# Patient Record
Sex: Female | Born: 1980 | Hispanic: Yes | Marital: Married | Smoking: Never smoker
Health system: Southern US, Community
[De-identification: ages and names within clinical notes are randomized; demographics above are authoritative.]

## PROBLEM LIST (undated history)

## (undated) HISTORY — PX: CHOLECYSTECTOMY: SHX55

---

## 2006-03-15 ENCOUNTER — Observation Stay: Payer: Self-pay | Admitting: Obstetrics and Gynecology

## 2006-03-16 ENCOUNTER — Observation Stay: Payer: Self-pay | Admitting: Obstetrics and Gynecology

## 2006-06-02 ENCOUNTER — Inpatient Hospital Stay: Payer: Self-pay

## 2013-12-20 ENCOUNTER — Emergency Department: Payer: Self-pay | Admitting: Emergency Medicine

## 2013-12-20 LAB — URINALYSIS, COMPLETE
BLOOD: NEGATIVE
Bacteria: NONE SEEN
Bilirubin,UR: NEGATIVE
GLUCOSE, UR: NEGATIVE mg/dL (ref 0–75)
Ketone: NEGATIVE
LEUKOCYTE ESTERASE: NEGATIVE
Nitrite: NEGATIVE
PROTEIN: NEGATIVE
Ph: 5 (ref 4.5–8.0)
Specific Gravity: 1.012 (ref 1.003–1.030)
Squamous Epithelial: 2
WBC UR: 1 /HPF (ref 0–5)

## 2013-12-20 LAB — COMPREHENSIVE METABOLIC PANEL
ALT: 19 U/L (ref 12–78)
ANION GAP: 8 (ref 7–16)
Albumin: 3.8 g/dL (ref 3.4–5.0)
Alkaline Phosphatase: 57 U/L
BILIRUBIN TOTAL: 0.3 mg/dL (ref 0.2–1.0)
BUN: 10 mg/dL (ref 7–18)
Calcium, Total: 8.5 mg/dL (ref 8.5–10.1)
Chloride: 107 mmol/L (ref 98–107)
Co2: 24 mmol/L (ref 21–32)
Creatinine: 0.51 mg/dL — ABNORMAL LOW (ref 0.60–1.30)
EGFR (Non-African Amer.): >60
GLUCOSE: 89 mg/dL (ref 65–99)
OSMOLALITY: 276 (ref 275–301)
POTASSIUM: 3.8 mmol/L (ref 3.5–5.1)
SGOT(AST): 17 U/L (ref 15–37)
SODIUM: 139 mmol/L (ref 136–145)
TOTAL PROTEIN: 7 g/dL (ref 6.4–8.2)

## 2013-12-20 LAB — CBC WITH DIFFERENTIAL/PLATELET
BASOS PCT: 1.2 %
Basophil #: 0.1 10*3/uL (ref 0.0–0.1)
EOS ABS: 0.1 10*3/uL (ref 0.0–0.7)
Eosinophil %: 1 %
HCT: 32.3 % — ABNORMAL LOW (ref 35.0–47.0)
HGB: 10 g/dL — ABNORMAL LOW (ref 12.0–16.0)
LYMPHS ABS: 1.6 10*3/uL (ref 1.0–3.6)
Lymphocyte %: 30.6 %
MCH: 20.5 pg — AB (ref 26.0–34.0)
MCHC: 30.9 g/dL — ABNORMAL LOW (ref 32.0–36.0)
MCV: 66 fL — ABNORMAL LOW (ref 80–100)
Monocyte #: 0.4 x10 3/mm (ref 0.2–0.9)
Monocyte %: 7.1 %
Neutrophil #: 3 10*3/uL (ref 1.4–6.5)
Neutrophil %: 60.1 %
Platelet: 331 10*3/uL (ref 150–440)
RBC: 4.88 10*6/uL (ref 3.80–5.20)
RDW: 17.4 % — ABNORMAL HIGH (ref 11.5–14.5)
WBC: 5.1 10*3/uL (ref 3.6–11.0)

## 2013-12-20 LAB — LIPASE, BLOOD: LIPASE: 134 U/L (ref 73–393)

## 2019-05-12 ENCOUNTER — Other Ambulatory Visit: Payer: Self-pay

## 2019-05-12 DIAGNOSIS — Z20822 Contact with and (suspected) exposure to covid-19: Secondary | ICD-10-CM

## 2019-05-14 LAB — NOVEL CORONAVIRUS, NAA: SARS-CoV-2, NAA: DETECTED — AB

## 2019-06-15 ENCOUNTER — Telehealth: Payer: Self-pay | Admitting: *Deleted

## 2019-06-15 NOTE — Telephone Encounter (Signed)
Patient called and is requesting COVID - 19 results mailed to her home address .   [place], [place] , [address] ,she has applied for asistance and they are requiring a copy of her results .

## 2019-07-02 ENCOUNTER — Telehealth: Payer: Self-pay

## 2019-07-02 NOTE — Telephone Encounter (Signed)
Patient requested to have covid result faxed to [employer] at [phone]. Request was sent to [facility] to fax.

## 2019-07-02 NOTE — Telephone Encounter (Signed)
Results faxed as requested

## 2019-11-26 ENCOUNTER — Encounter: Payer: Self-pay | Admitting: Dermatology

## 2019-11-26 ENCOUNTER — Other Ambulatory Visit: Payer: Self-pay

## 2019-11-26 ENCOUNTER — Ambulatory Visit: Payer: Medicaid Other | Admitting: Dermatology

## 2019-11-26 DIAGNOSIS — L820 Inflamed seborrheic keratosis: Secondary | ICD-10-CM

## 2019-11-26 DIAGNOSIS — L811 Chloasma: Secondary | ICD-10-CM | POA: Diagnosis not present

## 2019-11-26 NOTE — Patient Instructions (Addendum)
Recommend taking Heliocare sun protection supplement daily in sunny weather for additional sun protection. For maximum protection on the sunniest days, you can take up to 2 capsules of regular Heliocare OR take 1 capsule of Heliocare Ultra. For prolonged exposure (such as a full day in the sun), you can repeat your dose of the supplement 4 hours after your first dose. Heliocare can be purchased at [facility] or at [email or web address].    Instructions for [facility] Medications  One or more of your medications was sent to the [facility] mail order compounding pharmacy. You will receive an email from them and can purchase the medicine through that link. It will then be mailed to your home at the address you confirmed. If for any reason you do not receive an email from them, please check your spam folder. If you still do not find the email, please let us know.

## 2019-11-26 NOTE — Progress Notes (Addendum)
   New Patient Visit  Subjective  Sabrina Wolfe is a 39 y.o. female who presents for the following: growths (neck ~62m, itchy) and darker spots (face ~1-14yrs, not on birth control pills).  Has had four children but didn't have problems with dark spots until recently.   The following portions of the chart were reviewed this encounter and updated as appropriate:      Review of Systems:  No other skin or systemic complaints except as noted in HPI or Assessment and Plan.  Objective  Well appearing patient in no apparent distress; mood and affect are within normal limits.  A focused examination was performed including face, neck. Relevant physical exam findings are noted in the Assessment and Plan.  Objective  Neck x 32 (32): Multiple tiny waxy brown paps, itchy per patient  Objective  bil cheeks: Reticulated hyperpigmented patches.   Images       Assessment & Plan  Inflamed seborrheic keratosis (32) Neck x 32  Discussed ED, multiple txts needed to clear due to numerous lesions  ED x 32 today, wound care discussed    Destruction of lesion - Neck x 32 Complexity: extensive   Destruction method comment:  Electrodessication Informed consent: discussed and consent obtained   Outcome: patient tolerated procedure well with no complications   Post-procedure details: wound care instructions given    Melasma bil cheeks  Discussed chronic condition, worse in summer due to higher UV exposure.  Oral estrogen containing BCPs or supplements can exacerbate condition.  Recommend daily broad spectrum tinted sunscreen SPF 30+ to face, preferably with Zinc or Titanium Dioxide. Discussed Rx topical bleaching creams (i.e. hydroquinone), OTC HelioCare supplement, chemical peels (would need multiple for best result).    Recommend Elta UV Clear 46 tinted  Start [facility] Hydroquinone 8%, Tretinoin 0.1%, Kojic acid 1%, Niacinamide 4%, Fluocinolone 0.025% cream, a pea  sized amount nightly to dark spots on face for up to 2 months 60g 1rf. This cannot be used more than 3 months due to risk of exogenous ochronosis (permanent dark spots). The patient was advised this is not covered by insurance. They will receive an email to check out and the medication will be mailed to their home.       Return in about 2 months (around 01/26/2020) for ISK to txt with ED, and Melasma f/u.     Documentation: I have reviewed the above documentation for accuracy and completeness, and I agree with the above.  Willeen Niece, MD   I, Ardis Rowan, RMA, am acting as scribe for Willeen Niece, MD .

## 2019-12-14 NOTE — Addendum Note (Signed)
Addended by: Willeen Niece on: 12/14/2019 04:28 PM   Modules accepted: Level of Service

## 2020-01-27 ENCOUNTER — Ambulatory Visit: Payer: Medicaid Other | Admitting: Dermatology

## 2020-06-14 ENCOUNTER — Other Ambulatory Visit: Payer: Self-pay

## 2020-06-14 ENCOUNTER — Ambulatory Visit: Payer: Medicaid Other | Admitting: Dermatology

## 2020-06-14 DIAGNOSIS — L820 Inflamed seborrheic keratosis: Secondary | ICD-10-CM

## 2020-06-14 DIAGNOSIS — L811 Chloasma: Secondary | ICD-10-CM

## 2020-06-14 NOTE — Patient Instructions (Addendum)
Elta Clear Tinted - apply to face every morning for sun protection.  The Perfect Bleaching Cream - apply to dark spots on face every morning and night x 3 months. Stop using for 2 months, then may restart.

## 2020-06-14 NOTE — Progress Notes (Signed)
   Follow-Up Visit   Subjective  Sabrina Wolfe is a 39 y.o. female who presents for the following: Follow-up (ISKs on neck, improved with ED).  Itchy She also has melasma of the face, but didn't start topical cream. She would like to purchase something here or at a local pharmacy.  The following portions of the chart were reviewed this encounter and updated as appropriate:      Review of Systems:  No other skin or systemic complaints except as noted in HPI or Assessment and Plan.  Objective  Well appearing patient in no apparent distress; mood and affect are within normal limits.  A focused examination was performed including face, neck. Relevant physical exam findings are noted in the Assessment and Plan.  Objective  Cheeks: Reticulated hyperpigmentation on bil lower cheeks.  Objective  R neck x 21, L neck x 8 (29): Tiny waxy brown papules.   Assessment & Plan  Melasma Cheeks  Chronic condition, not improved, but didn't get/use Rx Recommend broad spectrum SPF and photoprotection.  Start The Perfect Bleaching Cream apply BID to dark spots x 3 months, then take a break. Recommend Elta Clear Tinted.  Discussed chronic condition, worse in summer due to higher UV exposure.  Oral estrogen containing BCPs or supplements can exacerbate condition.  Recommend daily broad spectrum tinted sunscreen SPF 30+ to face, preferably with Zinc or Titanium Dioxide. Discussed Rx topical bleaching creams (i.e. hydroquinone), OTC HelioCare supplement, chemical peels (would need multiple for best result).   HelioCare info given to patient.  Inflamed seborrheic keratosis (29) R neck x 21, L neck x 8  Improved from previous ED treatment.  Itchy per patient.  ED performed today, wound care discussed.  Prior to procedure, discussed risks of recurrence, small wound, skin dyspigmentation, or rare scar following ED.    Destruction of lesion - R neck x 21, L neck x 8  Destruction  method: electrodesiccation and curettage   Destruction method comment:  Electrodessication only Informed consent: discussed and consent obtained   Outcome: patient tolerated procedure well with no complications   Post-procedure details: wound care instructions given    Return in about 6 months (around 12/12/2020) for Melasma.   ICherlyn Labella, CMA, am acting as scribe for Willeen Niece, MD .  Documentation: I have reviewed the above documentation for accuracy and completeness, and I agree with the above.  Willeen Niece MD

## 2020-12-13 ENCOUNTER — Other Ambulatory Visit: Payer: Self-pay

## 2020-12-13 ENCOUNTER — Ambulatory Visit: Payer: Medicaid Other | Admitting: Dermatology

## 2020-12-26 ENCOUNTER — Other Ambulatory Visit: Payer: Self-pay

## 2020-12-26 ENCOUNTER — Emergency Department
Admission: EM | Admit: 2020-12-26 | Discharge: 2020-12-26 | Disposition: A | Discharge status: Home / Self Care | Payer: Medicaid Other | Attending: Emergency Medicine | Admitting: Emergency Medicine

## 2020-12-26 DIAGNOSIS — Y92410 Unspecified street and highway as the place of occurrence of the external cause: Secondary | ICD-10-CM | POA: Diagnosis not present

## 2020-12-26 DIAGNOSIS — R519 Headache, unspecified: Secondary | ICD-10-CM | POA: Diagnosis not present

## 2020-12-26 DIAGNOSIS — T148XXA Other injury of unspecified body region, initial encounter: Secondary | ICD-10-CM

## 2020-12-26 DIAGNOSIS — M542 Cervicalgia: Secondary | ICD-10-CM | POA: Diagnosis present

## 2020-12-26 MED ORDER — IBUPROFEN 600 MG PO TABS
600.0000 mg | ORAL_TABLET | Freq: Once | ORAL | Status: AC
  Administered 2020-12-26: 600 mg via ORAL
  Filled 2020-12-26: qty 1

## 2020-12-26 MED ORDER — OXYCODONE-ACETAMINOPHEN 5-325 MG PO TABS
1.0000 | ORAL_TABLET | Freq: Once | ORAL | Status: AC
  Administered 2020-12-26: 1 via ORAL
  Filled 2020-12-26: qty 1

## 2020-12-26 NOTE — ED Notes (Signed)
Pt placed in c-collar
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
## 2021-02-15 ENCOUNTER — Other Ambulatory Visit: Payer: Self-pay

## 2021-02-15 ENCOUNTER — Ambulatory Visit: Payer: Medicaid Other | Attending: Pediatrics | Admitting: Physical Therapy

## 2021-02-15 ENCOUNTER — Encounter: Payer: Self-pay | Admitting: Physical Therapy

## 2021-02-15 DIAGNOSIS — M5386 Other specified dorsopathies, lumbar region: Secondary | ICD-10-CM

## 2021-02-15 DIAGNOSIS — M5441 Lumbago with sciatica, right side: Secondary | ICD-10-CM
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
## 2021-02-20 ENCOUNTER — Ambulatory Visit: Payer: Medicaid Other | Admitting: Physical Therapy

## 2021-02-22 ENCOUNTER — Ambulatory Visit: Payer: Medicaid Other | Admitting: Physical Therapy

## 2021-02-22 ENCOUNTER — Other Ambulatory Visit: Payer: Self-pay

## 2021-02-22 DIAGNOSIS — M5386 Other specified dorsopathies, lumbar region: Secondary | ICD-10-CM

## 2021-02-22 DIAGNOSIS — M5441 Lumbago with sciatica, right side: Secondary | ICD-10-CM | POA: Diagnosis not present
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
## 2021-02-24 ENCOUNTER — Encounter: Payer: Self-pay | Admitting: Physical Therapy

## 2021-02-27 ENCOUNTER — Ambulatory Visit: Payer: Medicaid Other | Admitting: Physical Therapy

## 2021-02-28 ENCOUNTER — Ambulatory Visit: Payer: Medicaid Other | Attending: Pediatrics | Admitting: Physical Therapy

## 2021-02-28 ENCOUNTER — Other Ambulatory Visit: Payer: Self-pay

## 2021-02-28 DIAGNOSIS — M5386 Other specified dorsopathies, lumbar region: Secondary | ICD-10-CM | POA: Diagnosis present

## 2021-02-28 DIAGNOSIS — M5441 Lumbago with sciatica, right side: Secondary | ICD-10-CM | POA: Diagnosis present
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
## 2021-03-01 ENCOUNTER — Encounter: Payer: Medicaid Other | Admitting: Physical Therapy

## 2021-03-02 ENCOUNTER — Encounter: Payer: Self-pay | Admitting: Physical Therapy

## 2021-03-02 ENCOUNTER — Other Ambulatory Visit: Payer: Self-pay

## 2021-03-02 ENCOUNTER — Ambulatory Visit: Payer: Medicaid Other | Admitting: Physical Therapy

## 2021-03-02 DIAGNOSIS — M5441 Lumbago with sciatica, right side: Secondary | ICD-10-CM | POA: Diagnosis not present

## 2021-03-02 DIAGNOSIS — M5386 Other specified dorsopathies, lumbar region: Secondary | ICD-10-CM
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
## 2021-03-06 ENCOUNTER — Encounter: Payer: Medicaid Other | Admitting: Physical Therapy

## 2021-03-07 ENCOUNTER — Ambulatory Visit: Payer: Medicaid Other | Admitting: Physical Therapy

## 2021-03-07 ENCOUNTER — Other Ambulatory Visit: Payer: Self-pay

## 2021-03-07 ENCOUNTER — Encounter: Payer: Self-pay | Admitting: Physical Therapy

## 2021-03-07 DIAGNOSIS — M5441 Lumbago with sciatica, right side: Secondary | ICD-10-CM | POA: Diagnosis not present

## 2021-03-07 DIAGNOSIS — M5386 Other specified dorsopathies, lumbar region: Secondary | ICD-10-CM
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
## 2021-03-08 ENCOUNTER — Encounter: Payer: Medicaid Other | Admitting: Physical Therapy

## 2021-03-09 ENCOUNTER — Other Ambulatory Visit: Payer: Self-pay

## 2021-03-09 ENCOUNTER — Ambulatory Visit: Payer: Medicaid Other | Admitting: Physical Therapy

## 2021-03-09 DIAGNOSIS — M5441 Lumbago with sciatica, right side: Secondary | ICD-10-CM

## 2021-03-09 DIAGNOSIS — M5386 Other specified dorsopathies, lumbar region: Secondary | ICD-10-CM
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
## 2021-03-10 ENCOUNTER — Encounter: Payer: Self-pay | Admitting: Physical Therapy

## 2021-03-13 ENCOUNTER — Ambulatory Visit: Payer: Medicaid Other | Admitting: Physical Therapy

## 2021-03-14 ENCOUNTER — Ambulatory Visit: Payer: Medicaid Other | Admitting: Physical Therapy

## 2021-03-14 ENCOUNTER — Other Ambulatory Visit: Payer: Self-pay

## 2021-03-14 DIAGNOSIS — M5441 Lumbago with sciatica, right side: Secondary | ICD-10-CM | POA: Diagnosis not present

## 2021-03-14 DIAGNOSIS — M5386 Other specified dorsopathies, lumbar region: Secondary | ICD-10-CM
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
## 2021-03-15 ENCOUNTER — Encounter: Payer: Medicaid Other | Admitting: Physical Therapy

## 2021-03-16 ENCOUNTER — Ambulatory Visit: Payer: Medicaid Other | Admitting: Physical Therapy

## 2021-03-16 ENCOUNTER — Other Ambulatory Visit: Payer: Self-pay

## 2021-03-16 DIAGNOSIS — M5386 Other specified dorsopathies, lumbar region: Secondary | ICD-10-CM

## 2021-03-16 DIAGNOSIS — M5441 Lumbago with sciatica, right side: Secondary | ICD-10-CM | POA: Diagnosis not present
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
## 2021-03-18 ENCOUNTER — Encounter: Payer: Self-pay | Admitting: Physical Therapy

## 2021-03-20 ENCOUNTER — Encounter: Payer: Medicaid Other | Admitting: Physical Therapy

## 2021-03-21 ENCOUNTER — Other Ambulatory Visit: Payer: Self-pay

## 2021-03-21 ENCOUNTER — Ambulatory Visit: Payer: Medicaid Other | Admitting: Physical Therapy

## 2021-03-21 DIAGNOSIS — M5441 Lumbago with sciatica, right side: Secondary | ICD-10-CM | POA: Diagnosis not present

## 2021-03-21 DIAGNOSIS — M5386 Other specified dorsopathies, lumbar region: Secondary | ICD-10-CM
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
## 2021-03-22 ENCOUNTER — Encounter: Payer: Medicaid Other | Admitting: Physical Therapy

## 2021-03-23 ENCOUNTER — Ambulatory Visit: Payer: Medicaid Other | Admitting: Physical Therapy

## 2021-03-23 ENCOUNTER — Other Ambulatory Visit: Payer: Self-pay

## 2021-03-23 ENCOUNTER — Encounter: Payer: Self-pay | Admitting: Physical Therapy

## 2021-03-23 DIAGNOSIS — M5386 Other specified dorsopathies, lumbar region: Secondary | ICD-10-CM

## 2021-03-23 DIAGNOSIS — M5441 Lumbago with sciatica, right side: Secondary | ICD-10-CM | POA: Diagnosis not present
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
## 2021-03-25 ENCOUNTER — Encounter: Payer: Self-pay | Admitting: Physical Therapy

## 2021-03-27 ENCOUNTER — Encounter: Payer: Medicaid Other | Admitting: Physical Therapy

## 2021-03-28 ENCOUNTER — Other Ambulatory Visit: Payer: Self-pay

## 2021-03-28 ENCOUNTER — Encounter: Payer: Self-pay | Admitting: Physical Therapy

## 2021-03-28 ENCOUNTER — Ambulatory Visit: Payer: Medicaid Other | Admitting: Physical Therapy

## 2021-03-28 DIAGNOSIS — M5441 Lumbago with sciatica, right side: Secondary | ICD-10-CM

## 2021-03-28 DIAGNOSIS — M5386 Other specified dorsopathies, lumbar region: Secondary | ICD-10-CM
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
## 2021-03-29 ENCOUNTER — Encounter: Payer: Medicaid Other | Admitting: Physical Therapy

## 2021-03-30 ENCOUNTER — Other Ambulatory Visit: Payer: Self-pay

## 2021-03-30 ENCOUNTER — Ambulatory Visit: Payer: Medicaid Other | Attending: Pediatrics | Admitting: Physical Therapy

## 2021-03-30 DIAGNOSIS — M5441 Lumbago with sciatica, right side: Secondary | ICD-10-CM | POA: Insufficient documentation

## 2021-03-30 DIAGNOSIS — M5386 Other specified dorsopathies, lumbar region: Secondary | ICD-10-CM | POA: Diagnosis present
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
## 2021-04-01 ENCOUNTER — Encounter: Payer: Self-pay | Admitting: Physical Therapy

## 2021-04-12 ENCOUNTER — Ambulatory Visit: Payer: Medicaid Other | Admitting: Physical Therapy

## 2021-04-12 ENCOUNTER — Encounter: Payer: Self-pay | Admitting: Physical Therapy

## 2021-04-12 ENCOUNTER — Other Ambulatory Visit: Payer: Self-pay

## 2021-04-12 DIAGNOSIS — M5386 Other specified dorsopathies, lumbar region: Secondary | ICD-10-CM

## 2021-04-12 DIAGNOSIS — M5441 Lumbago with sciatica, right side: Secondary | ICD-10-CM | POA: Diagnosis not present
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
## 2021-04-17 ENCOUNTER — Other Ambulatory Visit: Payer: Self-pay

## 2021-04-17 ENCOUNTER — Encounter: Payer: Self-pay | Admitting: Physical Therapy

## 2021-04-17 ENCOUNTER — Ambulatory Visit: Payer: Medicaid Other | Admitting: Physical Therapy

## 2021-04-17 DIAGNOSIS — M5386 Other specified dorsopathies, lumbar region: Secondary | ICD-10-CM

## 2021-04-17 DIAGNOSIS — M5441 Lumbago with sciatica, right side: Secondary | ICD-10-CM | POA: Diagnosis not present
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
## 2021-04-19 ENCOUNTER — Other Ambulatory Visit: Payer: Self-pay

## 2021-04-19 ENCOUNTER — Ambulatory Visit: Payer: Medicaid Other | Admitting: Physical Therapy

## 2021-04-19 ENCOUNTER — Encounter: Payer: Self-pay | Admitting: Physical Therapy

## 2021-04-19 DIAGNOSIS — M5441 Lumbago with sciatica, right side: Secondary | ICD-10-CM | POA: Diagnosis not present

## 2021-04-19 DIAGNOSIS — M5386 Other specified dorsopathies, lumbar region: Secondary | ICD-10-CM
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
## 2021-04-24 ENCOUNTER — Ambulatory Visit: Payer: Medicaid Other | Admitting: Physical Therapy

## 2021-04-24 ENCOUNTER — Other Ambulatory Visit: Payer: Self-pay

## 2021-04-24 DIAGNOSIS — M5441 Lumbago with sciatica, right side: Secondary | ICD-10-CM | POA: Diagnosis not present

## 2021-04-24 DIAGNOSIS — M5386 Other specified dorsopathies, lumbar region: Secondary | ICD-10-CM
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
## 2021-04-26 ENCOUNTER — Other Ambulatory Visit: Payer: Self-pay

## 2021-04-26 ENCOUNTER — Ambulatory Visit: Payer: Medicaid Other | Admitting: Physical Therapy

## 2021-04-26 DIAGNOSIS — M5386 Other specified dorsopathies, lumbar region: Secondary | ICD-10-CM

## 2021-04-26 DIAGNOSIS — M5441 Lumbago with sciatica, right side: Secondary | ICD-10-CM
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
## 2021-04-27 ENCOUNTER — Encounter: Payer: Self-pay | Admitting: Physical Therapy

## 2021-05-01 ENCOUNTER — Other Ambulatory Visit: Payer: Self-pay

## 2021-05-01 ENCOUNTER — Ambulatory Visit: Payer: Medicaid Other | Attending: Pediatrics | Admitting: Physical Therapy

## 2021-05-01 DIAGNOSIS — M5386 Other specified dorsopathies, lumbar region: Secondary | ICD-10-CM | POA: Insufficient documentation

## 2021-05-01 DIAGNOSIS — M5441 Lumbago with sciatica, right side: Secondary | ICD-10-CM | POA: Insufficient documentation

## 2021-05-03 ENCOUNTER — Encounter: Payer: Medicaid Other | Admitting: Physical Therapy

## 2021-05-03 NOTE — Therapy (Addendum)
[facility] [address] [place], [place], [address] Phone: [phone]   Fax:  [phone]  Patient Details  Name: Pricilla Moehle MRN: [id number] Date of Birth: 1981-02-13 Referring Provider:  Delton Prairie, MD  Encounter Date: 05/01/2021    This encounter has been opened in error. Patient no-showed for this appointment. Patient is currently scheduled to be seen at 3:00 PM on 05/03/2021  *Addendum to remove pre-charting*  Consuela Mimes, PT, DPT [phone]  Gertie Exon 05/03/2021, 3:13 PM  [facility] [address] [place], [place], [address] Phone: [phone]   Fax:  [phone]

## 2021-05-08 ENCOUNTER — Other Ambulatory Visit: Payer: Self-pay

## 2021-05-08 ENCOUNTER — Ambulatory Visit: Payer: Medicaid Other | Admitting: Physical Therapy

## 2021-05-08 ENCOUNTER — Other Ambulatory Visit: Payer: Self-pay | Admitting: Orthopedic Surgery

## 2021-05-08 DIAGNOSIS — M5441 Lumbago with sciatica, right side: Secondary | ICD-10-CM

## 2021-05-08 DIAGNOSIS — M5386 Other specified dorsopathies, lumbar region: Secondary | ICD-10-CM

## 2021-05-08 DIAGNOSIS — M5416 Radiculopathy, lumbar region: Secondary | ICD-10-CM
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
## 2021-05-09 ENCOUNTER — Encounter: Payer: Self-pay | Admitting: Physical Therapy

## 2021-05-10 ENCOUNTER — Encounter: Payer: Self-pay | Admitting: Physical Therapy

## 2021-05-10 ENCOUNTER — Ambulatory Visit: Payer: Medicaid Other | Admitting: Physical Therapy

## 2021-05-10 ENCOUNTER — Other Ambulatory Visit: Payer: Self-pay

## 2021-05-10 DIAGNOSIS — M5441 Lumbago with sciatica, right side: Secondary | ICD-10-CM

## 2021-05-10 DIAGNOSIS — M5386 Other specified dorsopathies, lumbar region: Secondary | ICD-10-CM
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
## 2021-05-20 ENCOUNTER — Other Ambulatory Visit: Payer: Self-pay

## 2021-05-20 ENCOUNTER — Ambulatory Visit
Admission: RE | Admit: 2021-05-20 | Discharge: 2021-05-20 | Disposition: A | Discharge status: Home / Self Care | Payer: Medicaid Other | Source: Ambulatory Visit | Attending: Orthopedic Surgery | Admitting: Orthopedic Surgery

## 2021-05-20 DIAGNOSIS — M5441 Lumbago with sciatica, right side: Secondary | ICD-10-CM | POA: Diagnosis not present

## 2021-05-20 DIAGNOSIS — M5416 Radiculopathy, lumbar region: Secondary | ICD-10-CM

## 2021-05-24 ENCOUNTER — Other Ambulatory Visit: Payer: Self-pay | Admitting: Orthopedic Surgery

## 2021-05-24 DIAGNOSIS — K769 Liver disease, unspecified: Secondary | ICD-10-CM

## 2021-05-31 ENCOUNTER — Other Ambulatory Visit: Payer: Self-pay

## 2021-05-31 ENCOUNTER — Ambulatory Visit: Payer: Medicaid Other | Attending: Pediatrics | Admitting: Physical Therapy

## 2021-05-31 DIAGNOSIS — M5441 Lumbago with sciatica, right side: Secondary | ICD-10-CM | POA: Insufficient documentation

## 2021-05-31 DIAGNOSIS — M5386 Other specified dorsopathies, lumbar region: Secondary | ICD-10-CM | POA: Insufficient documentation
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
## 2021-06-02 ENCOUNTER — Other Ambulatory Visit: Payer: Self-pay

## 2021-06-02 ENCOUNTER — Ambulatory Visit
Admission: RE | Admit: 2021-06-02 | Discharge: 2021-06-02 | Disposition: A | Discharge status: Home / Self Care | Payer: Medicaid Other | Source: Ambulatory Visit | Attending: Orthopedic Surgery | Admitting: Orthopedic Surgery

## 2021-06-02 DIAGNOSIS — K769 Liver disease, unspecified: Secondary | ICD-10-CM | POA: Insufficient documentation

## 2021-06-03 ENCOUNTER — Encounter: Payer: Self-pay | Admitting: Physical Therapy

## 2021-06-05 ENCOUNTER — Other Ambulatory Visit: Payer: Self-pay

## 2021-06-05 ENCOUNTER — Ambulatory Visit: Payer: Medicaid Other | Admitting: Physical Therapy

## 2021-06-05 ENCOUNTER — Encounter: Payer: Self-pay | Admitting: Physical Therapy

## 2021-06-05 DIAGNOSIS — M5386 Other specified dorsopathies, lumbar region: Secondary | ICD-10-CM

## 2021-06-05 DIAGNOSIS — M5441 Lumbago with sciatica, right side: Secondary | ICD-10-CM | POA: Diagnosis not present
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
## 2021-06-07 ENCOUNTER — Other Ambulatory Visit: Payer: Self-pay

## 2021-06-07 ENCOUNTER — Ambulatory Visit: Payer: Medicaid Other | Admitting: Physical Therapy

## 2021-06-07 DIAGNOSIS — M5441 Lumbago with sciatica, right side: Secondary | ICD-10-CM

## 2021-06-07 DIAGNOSIS — M5386 Other specified dorsopathies, lumbar region: Secondary | ICD-10-CM
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
## 2021-06-08 ENCOUNTER — Encounter: Payer: Self-pay | Admitting: Physical Therapy

## 2021-06-12 ENCOUNTER — Other Ambulatory Visit: Payer: Self-pay

## 2021-06-12 ENCOUNTER — Ambulatory Visit: Payer: Medicaid Other | Admitting: Physical Therapy

## 2021-06-12 DIAGNOSIS — M5386 Other specified dorsopathies, lumbar region: Secondary | ICD-10-CM

## 2021-06-12 DIAGNOSIS — M5441 Lumbago with sciatica, right side: Secondary | ICD-10-CM
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
## 2021-06-13 ENCOUNTER — Encounter: Payer: Self-pay | Admitting: Physical Therapy

## 2021-06-14 ENCOUNTER — Ambulatory Visit: Payer: Medicaid Other | Admitting: Physical Therapy

## 2021-06-14 ENCOUNTER — Other Ambulatory Visit: Payer: Self-pay

## 2021-06-14 DIAGNOSIS — M5441 Lumbago with sciatica, right side: Secondary | ICD-10-CM

## 2021-06-14 DIAGNOSIS — M5386 Other specified dorsopathies, lumbar region: Secondary | ICD-10-CM
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
## 2021-06-17 ENCOUNTER — Encounter: Payer: Self-pay | Admitting: Physical Therapy

## 2021-06-19 ENCOUNTER — Other Ambulatory Visit: Payer: Self-pay

## 2021-06-19 ENCOUNTER — Ambulatory Visit: Payer: Medicaid Other | Admitting: Physical Therapy

## 2021-06-19 DIAGNOSIS — M5441 Lumbago with sciatica, right side: Secondary | ICD-10-CM

## 2021-06-19 DIAGNOSIS — M5386 Other specified dorsopathies, lumbar region: Secondary | ICD-10-CM
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
## 2021-06-20 ENCOUNTER — Encounter: Payer: Self-pay | Admitting: Physical Therapy

## 2021-06-21 ENCOUNTER — Ambulatory Visit: Payer: Medicaid Other | Admitting: Physical Therapy

## 2021-06-21 ENCOUNTER — Other Ambulatory Visit: Payer: Self-pay

## 2021-06-21 DIAGNOSIS — M5441 Lumbago with sciatica, right side: Secondary | ICD-10-CM | POA: Diagnosis not present

## 2021-06-21 DIAGNOSIS — M5386 Other specified dorsopathies, lumbar region: Secondary | ICD-10-CM
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
## 2021-06-25 ENCOUNTER — Encounter: Payer: Self-pay | Admitting: Physical Therapy

## 2021-06-26 ENCOUNTER — Ambulatory Visit: Payer: Medicaid Other | Admitting: Physical Therapy

## 2021-06-26 NOTE — Patient Instructions (Incomplete)
***   Goal update, last visit under ins ***     TREATMENT       Therapeutic Exercise - sustained position/repeated movement for symptom modulation and centralization, lumbopelvic mobility and ROM, graded loading for lumbopelvic and paraspinal musculature to reduce threat with work-related and household activities   Repeated extension in lying, with patient overpressure; 1x10     Quadruped cat cow; x10 Lower trunk rotations with QL bias (figure-4 position); x10 with each leg crossed   Prone alternating hip extension; 2x10   Bridge with silver physioball; long-lever; 2x6, 5 sec hold at top   Nautilus core walkout; x10 posterior; with waist strap looped around pelvis    Pallof press; 2x10 ea dir; 30 lbs   Goblet squat; 6-lb Medicine ball; 2x10         *not today* Bird dog; 2x10, alternating Dying bug; x10 alternating Wall ball squat, Silver physioball; 2x10 Repeated extension in lying, with clinician overpressure; 2x10   Squat to chair, standard chair height; 1x10 Supine piriformis stretch RLE; x 1 min Starting in quadruped, Child's pose,  x10, 5 sec Repeated extension in lying with sideglide to R; x10 Lower trunk rotations; 1x10 alternating Upper trapezius stretch; 2x30seconds, bilateral Open book, in sidelying; x10 on each side Repeated extension in lying, with patient overpressure; 1x10  Seated sciatic nerve glider with cervical extension/flexion; 2x10 Prone on elbows; x 4 min             ASSESSMENT Patient has been able to progress with volume of upright activity with treadmill walking program in her gym and has significantly lower level of pain relative to that reported early in PT plan of care. She demonstrates good thoracolumbar ROM and has been able to significantly progress with graded loading. Pain along R lower lumbar paraspinal region is abated following repeated extension in lying with patient overpressure. Pt is still following up with physician regarding  comorbid upper quarter complaints for which home exercises were given last visit. Patient has remaining deficits in accessing lumbar spine AROM; R-sided low back pain with RLE referred pain; increased sensitivity along R lower lumbar paraspinals and R gluteal mm; and positional tolerance for sitting, prolonged standing/walking, and flexion-based activities. Pt will benefit from skilled PT services to address deficits and return to pain-free function at home and work.

## 2021-06-29 ENCOUNTER — Encounter: Payer: Self-pay | Admitting: Physical Therapy

## 2021-06-29 ENCOUNTER — Other Ambulatory Visit: Payer: Self-pay

## 2021-06-29 ENCOUNTER — Ambulatory Visit: Payer: Medicaid Other | Attending: Chiropractic Medicine | Admitting: Physical Therapy

## 2021-06-29 DIAGNOSIS — M5386 Other specified dorsopathies, lumbar region: Secondary | ICD-10-CM | POA: Diagnosis present

## 2021-06-29 DIAGNOSIS — M5441 Lumbago with sciatica, right side: Secondary | ICD-10-CM | POA: Insufficient documentation
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
## 2021-08-08 ENCOUNTER — Other Ambulatory Visit: Payer: Self-pay | Admitting: Primary Care

## 2021-08-08 ENCOUNTER — Ambulatory Visit
Admission: RE | Admit: 2021-08-08 | Discharge: 2021-08-08 | Disposition: A | Discharge status: Home / Self Care | Payer: Medicaid Other | Attending: Primary Care | Admitting: Primary Care

## 2021-08-08 ENCOUNTER — Ambulatory Visit
Admission: RE | Admit: 2021-08-08 | Discharge: 2021-08-08 | Disposition: A | Discharge status: Home / Self Care | Payer: Medicaid Other | Source: Ambulatory Visit | Attending: Primary Care | Admitting: Primary Care

## 2021-08-08 DIAGNOSIS — M542 Cervicalgia: Secondary | ICD-10-CM | POA: Insufficient documentation

## 2021-10-18 ENCOUNTER — Ambulatory Visit: Payer: Medicaid Other | Attending: Orthopedic Surgery

## 2021-10-18 ENCOUNTER — Other Ambulatory Visit: Payer: Self-pay

## 2021-10-18 DIAGNOSIS — M542 Cervicalgia: Secondary | ICD-10-CM | POA: Insufficient documentation

## 2021-10-18 DIAGNOSIS — G8929 Other chronic pain: Secondary | ICD-10-CM | POA: Diagnosis present

## 2021-10-18 DIAGNOSIS — M6281 Muscle weakness (generalized): Secondary | ICD-10-CM | POA: Insufficient documentation

## 2021-10-18 DIAGNOSIS — M545 Low back pain, unspecified: Secondary | ICD-10-CM | POA: Insufficient documentation

## 2021-10-18 DIAGNOSIS — M5441 Lumbago with sciatica, right side: Secondary | ICD-10-CM | POA: Diagnosis present
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
## 2021-10-19 NOTE — Addendum Note (Signed)
Addended by: Baird Kay on: 10/19/2021 10:09 AM ? ? Modules accepted: Orders ? ?

## 2021-10-24 ENCOUNTER — Other Ambulatory Visit: Payer: Self-pay

## 2021-10-24 ENCOUNTER — Encounter: Payer: Self-pay | Admitting: Physical Therapy

## 2021-10-24 ENCOUNTER — Ambulatory Visit: Payer: Medicaid Other | Admitting: Physical Therapy

## 2021-10-24 DIAGNOSIS — M6281 Muscle weakness (generalized): Secondary | ICD-10-CM

## 2021-10-24 DIAGNOSIS — M542 Cervicalgia: Secondary | ICD-10-CM

## 2021-10-24 DIAGNOSIS — G8929 Other chronic pain: Secondary | ICD-10-CM

## 2021-10-24 DIAGNOSIS — M5441 Lumbago with sciatica, right side: Secondary | ICD-10-CM
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
## 2021-10-25 NOTE — Patient Instructions (Signed)
Previous HEP given: ?Access Code [email or web address] ?

## 2021-10-26 ENCOUNTER — Ambulatory Visit: Payer: Medicaid Other

## 2021-10-26 DIAGNOSIS — M6281 Muscle weakness (generalized): Secondary | ICD-10-CM

## 2021-10-26 DIAGNOSIS — M542 Cervicalgia: Secondary | ICD-10-CM | POA: Diagnosis not present

## 2021-10-26 DIAGNOSIS — G8929 Other chronic pain: Secondary | ICD-10-CM
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
## 2021-10-30 ENCOUNTER — Ambulatory Visit: Payer: Medicaid Other | Attending: Orthopedic Surgery

## 2021-10-30 DIAGNOSIS — M5441 Lumbago with sciatica, right side: Secondary | ICD-10-CM | POA: Insufficient documentation

## 2021-10-30 DIAGNOSIS — M5386 Other specified dorsopathies, lumbar region: Secondary | ICD-10-CM | POA: Insufficient documentation

## 2021-10-30 DIAGNOSIS — M545 Low back pain, unspecified: Secondary | ICD-10-CM | POA: Insufficient documentation

## 2021-10-30 DIAGNOSIS — G8929 Other chronic pain: Secondary | ICD-10-CM | POA: Diagnosis present

## 2021-10-30 DIAGNOSIS — M6281 Muscle weakness (generalized): Secondary | ICD-10-CM | POA: Diagnosis present

## 2021-10-30 DIAGNOSIS — M542 Cervicalgia: Secondary | ICD-10-CM | POA: Insufficient documentation

## 2021-10-30 NOTE — Therapy (Signed)
?[facility] ?[place], [place], [address] ?Phone: [phone]   Fax:  [phone] ? ?Physical Therapy Treatment ? ?Patient Details  ?Name: Sabrina Wolfe ?MRN: [id number] ?Date of Birth: December 30, 1980 ?Referring Provider (PT): Heron Sabins, [place] ? ? ?Encounter Date: 10/30/2021 ? ? PT End of Session - 10/30/21 1237   ? ? Visit Number 4   ? Number of Visits 25   ? Date for PT Re-Evaluation 01/10/22   ? PT Start Time [phone]   ? PT Stop Time 1014   ? PT Time Calculation (min) 33 min   ? Activity Tolerance Patient tolerated treatment well   ? Behavior During Therapy [facility] for tasks assessed/performed   ? ?  ?  ? ?  ? ? ?History reviewed. No pertinent past medical history. ? ?Past Surgical History:  ?Procedure Laterality Date  ? CHOLECYSTECTOMY    ? 20 years ago  ? ? ?There were no vitals filed for this visit. ? ? Subjective Assessment - 10/30/21 1218   ? ? Subjective Patient reports her dizziness continues to be present. Reports her doctor wants a letter from PT for in regards for her needing an ENT due to dizziness.   ? Patient is accompained by: Interpreter   ? Pertinent History Pt presents to PT evaluation with interpreter (pt primarily speaks [place]) Pt in [place] 12/25/2020. Previously seen at another PT clinic to address LBP (back pain did not resolve). Pt reports immediate LBP and neck pain following accident that has remained. Pt with difficulty keeping her neck in a neutral position or with bending her neck to look down due to pain. She feels her neck fatigues quicky. She feels this pain mostly on her R side. Pt has N/T in B hands and BLEs. She endorses headaches that ?wrap around? from occipital region to her L eye. Pt does feel dizzy at times when going from a bent over to standing position. She describes this as spinning. She reports she has noticed changes in her memory since the accident and that she ?forgets things easily.? She  reports her balance has worsened. Pt has not yet seen a neurologist. Pt reports her pain has significantly affected her ability to be active and perform her job duties. Pt formerly very active, enjoyed exercising. Her pain is affecting her sleep. She can no longer lie down on her L side without R side neck pain. She describes difficulties with turning and that her R low back ?feels like it wants to come apart.?   ? Limitations Sitting;Standing;Reading;Lifting;House hold activities;Walking   ? How long can you sit comfortably? Pt reports she is not comfortable still, she has to constantly change positions   ? How long can you stand comfortably? cannot stand still comfortably   ? How long can you walk comfortably? 4-5 hours   ? Diagnostic tests per chart "Cervical x-rays revealing mild multilevel spondylolysis and patient with symptoms of right cervical radiculitis.'; 08/08/21 DG cervical spine "IMPRESSION:  Mild degenerative change C5-C6, C6-C7, C7-T1. Small corticated bony  density noted along the anterior inferior aspect of the lower  endplate of C6. This may be related to degenerative change and or  old injury. No evidence of acute fracture or dislocation." MR LUMBAR SPINE 05/20/21 per chart: "IMPRESSION:  1. No spinal canal stenosis or neural foraminal narrowing.  2. Innumerable tiny cysts in the liver. These could represent  biliary hamartomas but are incompletely evaluated. An ultrasound of  the liver is recommended."   ? Patient Stated Goals Pt would like to improve her pain. Pt would like to not have to take medicine for pain relief. Pt would like to be able to perform her job duties without pain.   ? Currently in Pain? Yes   ? Pain Score 4    ? Pain Location Neck   ? Pain Orientation Right;Left   ? Pain Descriptors / Indicators Aching   ? Pain Type Chronic pain   ? Pain Onset More than a month ago   ? ?  ?  ? ?  ? ? ? ? ? ? ?  ?Treatment:  ?  ?Cervical rotation with overpressure at glenohumeral joint and  occiput 2x30 second holds ?Suboccipital release 3x30 seconds ?CPA and UPA thoracic and cervical spine 3x10 seconds each level; tenderness with C4-5 R UPA ?J mobilization 3x30 second holds ?  ?Towel distraction with cervical extension 10x 5 second holds ?Scapular detraction with cervical rotation 10x each direction ?Green swiss ball TrA activation with compression between knees and hands 10x 3 second holds  ?Chin tucks 10x 3 second holds ? ?Trigger Point Dry Needling (TDN), unbilled ?Education performed with patient regarding potential benefit of TDN. Reviewed precautions and risks with patient. Reviewed special precautions/risks over lung fields which include pneumothorax. Reviewed signs and symptoms of pneumothorax and advised pt to go to ER immediately if these symptoms develop advise them of dry needling treatment. Extensive time spent with pt to ensure full understanding of TDN risks. Pt provided verbal consent to treatment. TDN performed to  with 0.3 x 30 single needle placements with local twitch response (LTR). Pistoning technique utilized. Improved pain-free motion following intervention. Muscles targeted L cervical paraspinals, suboccipitals, L lumbar paraspinals x 12 minutes ? ? ?Pt educated throughout session about proper posture and technique with exercises. Improved exercise technique, movement at target joints, use of target muscles after min to mod verbal, visual, tactile cues. ? ? ?Patient is late for PT session limiting session duration. She has significant trigger points in cervical spine that relieved with TDN. Patient is able to perform chin tucks without pain increase this session. Combination of interventions. She would benefit from additional skilled PT Intervention to improve ROM/postural control and reduce pain with ADLs. ? ? ? ? ? ? ? ? ? ? ? ? ? ? ? ? ? ? ? ? PT Education - 10/30/21 1237   ? ? Education Details exercise technique, body mechanics, pain reduction technique,   ? Person(s)  Educated Patient   ? Methods Explanation;Demonstration;Tactile cues;Verbal cues   ? Comprehension Verbalized understanding;Returned demonstration;Verbal cues required;Tactile cues required   ? ?  ?  ? ?  ? ? ? PT Short Term Goals - 10/18/21 1706   ? ?  ? PT SHORT TERM GOAL #1  ? Title Patient will be independent in home exercise program to improve strength/mobility for better functional independence with ADLs.   ? Baseline 3/22: to be issued next 1-2 visits   ? Time 6   ? Period Weeks   ? Status New   ? Target Date 11/29/21   ?  ? PT SHORT TERM GOAL #2  ? Title Patient will report a worst pain of 3/10 to improve tolerance with ADLs and reduced symptoms with work activities.   ? Baseline 3/22: worst pain 8/10   ? Time 6   ? Period Weeks   ? Status New   ? Target Date 11/29/21   ? ?  ?  ? ?  ? ? ? ?  PT Long Term Goals - 10/18/21 1707   ? ?  ? PT LONG TERM GOAL #1  ? Title Patient will increase FOTO score to equal to or greater than 71  to demonstrate statistically significant improvement in mobility and quality of life.   ? Baseline 3/22: 60   ? Time 12   ? Period Weeks   ? Status New   ? Target Date 01/10/22   ?  ? PT LONG TERM GOAL #2  ? Title Patient will increase BUE gross strength and gross strength of cervical musculature to at least a 4+/5 to improve ADL ability and QOL.   ? Baseline 3/22: UE strength grossly 4/5 B, cervical musculature grossly  4-/5   ? Time 12   ? Period Weeks   ? Status New   ? Target Date 01/10/22   ?  ? PT LONG TERM GOAL #3  ? Title Pt will demonstrate 15 degree increase in cervical extension ROM and B cervical rotation ROM that is pain free for improved functional mobility and QOL.   ? Baseline 3/22: ext 40 deg and painful, rotation R 60 deg and painful, rotation L 40 deg and painful   ? Time 12   ? Period Weeks   ? Status New   ? Target Date 01/10/22   ?  ? PT LONG TERM GOAL #4  ? Title Patient will reduce Neck Disability Index score to <10% to demonstrate minimal disability with ADL?s  including improved sleeping tolerance, sitting tolerance, etc for better mobility at home and work.   ? Baseline 3/22: to be completed future session   ? Time 12   ? Period Weeks   ? Status New   ? Target Date 06/

## 2021-11-01 ENCOUNTER — Ambulatory Visit: Payer: Medicaid Other

## 2021-11-01 DIAGNOSIS — M542 Cervicalgia: Secondary | ICD-10-CM

## 2021-11-01 DIAGNOSIS — M6281 Muscle weakness (generalized): Secondary | ICD-10-CM

## 2021-11-01 DIAGNOSIS — M545 Low back pain, unspecified: Secondary | ICD-10-CM

## 2021-11-01 DIAGNOSIS — G8929 Other chronic pain: Secondary | ICD-10-CM

## 2021-11-01 NOTE — Therapy (Signed)
[place] ?[facility] ?[address] ?[place], [place], [address] ?Phone: [phone]   Fax:  [phone] ? ?Physical Therapy Treatment ? ?Patient Details  ?Name: Sabrina Wolfe ?MRN: [id number] ?Date of Birth: 1981/04/23 ?Referring Provider (PT): Charlyne Quale, [place] ? ? ?Encounter Date: 11/01/2021 ? ? PT End of Session - 11/01/21 0837   ? ? Visit Number 5   ? Number of Visits 25   ? Date for PT Re-Evaluation 01/10/22   ? PT Start Time [phone]   ? PT Stop Time 0845   ? PT Time Calculation (min) 39 min   ? Activity Tolerance Patient tolerated treatment well   ? Behavior During Therapy [facility] for tasks assessed/performed   ? ?  ?  ? ?  ? ? ?History reviewed. No pertinent past medical history. ? ?Past Surgical History:  ?Procedure Laterality Date  ? CHOLECYSTECTOMY    ? 20 years ago  ? ? ?There were no vitals filed for this visit. ? ? Subjective Assessment - 11/01/21 0834   ? ? Subjective Patient reports her pain is improving. Has been working with occaasional pain increases.  Reports the needling has been helping.   ? Patient is accompained by: Interpreter   ? Pertinent History Pt presents to PT evaluation with interpreter (pt primarily speaks [place]) Pt in [place] 12/25/2020. Previously seen at another PT clinic to address LBP (back pain did not resolve). Pt reports immediate LBP and neck pain following accident that has remained. Pt with difficulty keeping her neck in a neutral position or with bending her neck to look down due to pain. She feels her neck fatigues quicky. She feels this pain mostly on her R side. Pt has N/T in B hands and BLEs. She endorses headaches that ?wrap around? from occipital region to her L eye. Pt does feel dizzy at times when going from a bent over to standing position. She describes this as spinning. She reports she has noticed changes in her memory since the accident and that she ?forgets things easily.? She reports her balance has  worsened. Pt has not yet seen a neurologist. Pt reports her pain has significantly affected her ability to be active and perform her job duties. Pt formerly very active, enjoyed exercising. Her pain is affecting her sleep. She can no longer lie down on her L side without R side neck pain. She describes difficulties with turning and that her R low back ?feels like it wants to come apart.?   ? Limitations Sitting;Standing;Reading;Lifting;House hold activities;Walking   ? How long can you sit comfortably? Pt reports she is not comfortable still, she has to constantly change positions   ? How long can you stand comfortably? cannot stand still comfortably   ? How long can you walk comfortably? 4-5 hours   ? Diagnostic tests per chart "Cervical x-rays revealing mild multilevel spondylolysis and patient with symptoms of right cervical radiculitis.'; 08/08/21 DG cervical spine "IMPRESSION:  Mild degenerative change C5-C6, C6-C7, C7-T1. Small corticated bony  density noted along the anterior inferior aspect of the lower  endplate of C6. This may be related to degenerative change and or  old injury. No evidence of acute fracture or dislocation." MR LUMBAR SPINE 05/20/21 per chart: "IMPRESSION:  1. No spinal canal stenosis or neural foraminal narrowing.  2. Innumerable tiny cysts in the liver. These could represent  biliary hamartomas but are incompletely evaluated. An ultrasound of  the liver is recommended."   ?  Patient Stated Goals Pt would like to improve her pain. Pt would like to not have to take medicine for pain relief. Pt would like to be able to perform her job duties without pain.   ? Currently in Pain? Yes   ? Pain Score 3    ? Pain Location Neck   and low back  ? Pain Orientation Left   ? Pain Descriptors / Indicators Aching   ? Pain Type Chronic pain   ? Pain Onset More than a month ago   ? Pain Frequency Constant   ? ?  ?  ? ?  ? ? ? ? ? ? ?Treatment:  ?  ?Cervical rotation with overpressure at glenohumeral joint  and occiput 2x30 second holds ?Suboccipital release 3x30 seconds ?CPA and UPA thoracic and cervical spine 3x10 seconds each level; tenderness with C4-5 R UPA ?J mobilization 3x30 second holds ?  ?Towel distraction with cervical extension 10x 5 second holds ?Scapular detraction with cervical rotation 10x each direction ?Posterior pelvic tilt 10x ?Posterior pelvic tilt with adduction ball squeeze 10x  ? ?Chin tucks 10x 3 second holds ?Wall posture with chin chuck and scapular retractions 8x 10 second holds ?  ?Trigger Point Dry Needling (TDN), unbilled ?Education performed with patient regarding potential benefit of TDN. Reviewed precautions and risks with patient. Reviewed special precautions/risks over lung fields which include pneumothorax. Reviewed signs and symptoms of pneumothorax and advised pt to go to ER immediately if these symptoms develop advise them of dry needling treatment. Extensive time spent with pt to ensure full understanding of TDN risks. Pt provided verbal consent to treatment. TDN performed to  with 0.3 x 30 single needle placements with local twitch response (LTR). Pistoning technique utilized. Improved pain-free motion following intervention. Muscles targeted L cervical paraspinals, suboccipitals, R upper trap  x6 minutes ?  ?  ?Pt educated throughout session about proper posture and technique with exercises. Improved exercise technique, movement at target joints, use of target muscles after min to mod verbal, visual, tactile cues. ? ? ?Patient presents with excellent motivation. Postural strengthening and muscle tissue lengthening interventions tolerated well with decreased pain by end of session. Cervical paraspinals and upper trap has significant trigger points that are released with TDN. She would benefit from additional skilled PT Intervention to improve ROM/postural control and reduce pain with ADLs ? ? ? ? ? ? ? ? ? ? ? ? ? ? ? ? ? ? ? PT Education - 11/01/21 0837   ? ? Education  Details exercise technique, TDN, body mechanics   ? Person(s) Educated Patient   ? Methods Explanation;Demonstration;Tactile cues;Verbal cues   ? Comprehension Verbalized understanding;Returned demonstration;Verbal cues required;Tactile cues required   ? ?  ?  ? ?  ? ? ? PT Short Term Goals - 10/18/21 1706   ? ?  ? PT SHORT TERM GOAL #1  ? Title Patient will be independent in home exercise program to improve strength/mobility for better functional independence with ADLs.   ? Baseline 3/22: to be issued next 1-2 visits   ? Time 6   ? Period Weeks   ? Status New   ? Target Date 11/29/21   ?  ? PT SHORT TERM GOAL #2  ? Title Patient will report a worst pain of 3/10 to improve tolerance with ADLs and reduced symptoms with work activities.   ? Baseline 3/22: worst pain 8/10   ? Time 6   ? Period Weeks   ? Status  New   ? Target Date 11/29/21   ? ?  ?  ? ?  ? ? ? ? PT Long Term Goals - 10/18/21 1707   ? ?  ? PT LONG TERM GOAL #1  ? Title Patient will increase FOTO score to equal to or greater than 71  to demonstrate statistically significant improvement in mobility and quality of life.   ? Baseline 3/22: 60   ? Time 12   ? Period Weeks   ? Status New   ? Target Date 01/10/22   ?  ? PT LONG TERM GOAL #2  ? Title Patient will increase BUE gross strength and gross strength of cervical musculature to at least a 4+/5 to improve ADL ability and QOL.   ? Baseline 3/22: UE strength grossly 4/5 B, cervical musculature grossly  4-/5   ? Time 12   ? Period Weeks   ? Status New   ? Target Date 01/10/22   ?  ? PT LONG TERM GOAL #3  ? Title Pt will demonstrate 15 degree increase in cervical extension ROM and B cervical rotation ROM that is pain free for improved functional mobility and QOL.   ? Baseline 3/22: ext 40 deg and painful, rotation R 60 deg and painful, rotation L 40 deg and painful   ? Time 12   ? Period Weeks   ? Status New   ? Target Date 01/10/22   ?  ? PT LONG TERM GOAL #4  ? Title Patient will reduce Neck Disability  Index score to <10% to demonstrate minimal disability with ADL?s including improved sleeping tolerance, sitting tolerance, etc for better mobility at home and work.   ? Baseline 3/22: to be completed future session

## 2021-11-06 ENCOUNTER — Ambulatory Visit: Payer: Medicaid Other

## 2021-11-06 ENCOUNTER — Encounter: Payer: Self-pay | Admitting: Physical Therapy

## 2021-11-06 DIAGNOSIS — M542 Cervicalgia: Secondary | ICD-10-CM

## 2021-11-06 DIAGNOSIS — M6281 Muscle weakness (generalized): Secondary | ICD-10-CM

## 2021-11-06 DIAGNOSIS — M5441 Lumbago with sciatica, right side: Secondary | ICD-10-CM

## 2021-11-06 DIAGNOSIS — M5386 Other specified dorsopathies, lumbar region: Secondary | ICD-10-CM

## 2021-11-06 DIAGNOSIS — G8929 Other chronic pain: Secondary | ICD-10-CM
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
## 2021-11-08 ENCOUNTER — Ambulatory Visit: Payer: Medicaid Other | Admitting: Physical Therapy

## 2021-11-08 ENCOUNTER — Encounter: Payer: Self-pay | Admitting: Physical Therapy

## 2021-11-08 DIAGNOSIS — M542 Cervicalgia: Secondary | ICD-10-CM | POA: Diagnosis not present

## 2021-11-08 DIAGNOSIS — M545 Low back pain, unspecified: Secondary | ICD-10-CM

## 2021-11-08 DIAGNOSIS — M6281 Muscle weakness (generalized): Secondary | ICD-10-CM

## 2021-11-08 DIAGNOSIS — M5441 Lumbago with sciatica, right side: Secondary | ICD-10-CM

## 2021-11-08 DIAGNOSIS — M5386 Other specified dorsopathies, lumbar region: Secondary | ICD-10-CM
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
## 2021-11-13 ENCOUNTER — Ambulatory Visit: Payer: Medicaid Other | Admitting: Physical Therapy

## 2021-11-13 ENCOUNTER — Encounter: Payer: Self-pay | Admitting: Physical Therapy

## 2021-11-13 DIAGNOSIS — M6281 Muscle weakness (generalized): Secondary | ICD-10-CM

## 2021-11-13 DIAGNOSIS — M542 Cervicalgia: Secondary | ICD-10-CM

## 2021-11-13 DIAGNOSIS — G8929 Other chronic pain: Secondary | ICD-10-CM

## 2021-11-13 DIAGNOSIS — M5441 Lumbago with sciatica, right side: Secondary | ICD-10-CM

## 2021-11-13 DIAGNOSIS — M5386 Other specified dorsopathies, lumbar region: Secondary | ICD-10-CM
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
## 2021-11-15 ENCOUNTER — Ambulatory Visit: Payer: Medicaid Other

## 2021-11-16 ENCOUNTER — Ambulatory Visit: Payer: Medicaid Other

## 2021-11-16 DIAGNOSIS — M542 Cervicalgia: Secondary | ICD-10-CM | POA: Diagnosis not present

## 2021-11-16 DIAGNOSIS — G8929 Other chronic pain: Secondary | ICD-10-CM

## 2021-11-16 DIAGNOSIS — M6281 Muscle weakness (generalized): Secondary | ICD-10-CM

## 2021-11-16 DIAGNOSIS — M545 Low back pain, unspecified: Secondary | ICD-10-CM
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
## 2021-11-20 ENCOUNTER — Encounter: Payer: Self-pay | Admitting: Physical Therapy

## 2021-11-20 ENCOUNTER — Ambulatory Visit: Payer: Medicaid Other | Admitting: Physical Therapy

## 2021-11-20 DIAGNOSIS — M542 Cervicalgia: Secondary | ICD-10-CM | POA: Diagnosis not present

## 2021-11-20 DIAGNOSIS — M545 Low back pain, unspecified: Secondary | ICD-10-CM

## 2021-11-20 DIAGNOSIS — M6281 Muscle weakness (generalized): Secondary | ICD-10-CM

## 2021-11-20 DIAGNOSIS — M5441 Lumbago with sciatica, right side: Secondary | ICD-10-CM

## 2021-11-20 NOTE — Therapy (Signed)
[facility] ?[place], [place], [address] ?Phone: [phone]   Fax:  [phone] ? ?Physical Therapy Treatment ?Physical Therapy Progress Note ? ? ?Dates of reporting period  10/18/21   to   11/20/21 ? ? ?Patient Details  ?Name: Sabrina Wolfe ?MRN: [id number] ?Date of Birth: [birth year] ?Referring Provider (PT): Heron Sabins, [place] ? ? ?Encounter Date: 11/20/2021 ? ? PT End of Session - 11/20/21 0815   ? ? Visit Number 10   ? Number of Visits 25   ? Date for PT Re-Evaluation 01/10/22   ? PT Start Time [phone]   ? PT Stop Time 0845   ? PT Time Calculation (min) 37 min   ? Activity Tolerance Patient tolerated treatment well   ? Behavior During Therapy [facility] for tasks assessed/performed   ? ?  ?  ? ?  ? ? ?History reviewed. No pertinent past medical history. ? ?Past Surgical History:  ?Procedure Laterality Date  ? CHOLECYSTECTOMY    ? 20 years ago  ? ? ?There were no vitals filed for this visit. ? ? Subjective Assessment - 11/20/21 0813   ? ? Subjective Patient reports having pain in her back and neck. About a 3-4/10. She reports adherence with HEP doing them every day;   ? Patient is accompained by: Interpreter   ? Pertinent History Pt presents to PT evaluation with interpreter (pt primarily speaks [place]) Pt in [place] 12/25/2020. Previously seen at another PT clinic to address LBP (back pain did not resolve). Pt reports immediate LBP and neck pain following accident that has remained. Pt with difficulty keeping her neck in a neutral position or with bending her neck to look down due to pain. She feels her neck fatigues quicky. She feels this pain mostly on her R side. Pt has N/T in B hands and BLEs. She endorses headaches that ?wrap around? from occipital region to her L eye. Pt does feel dizzy at times when going from a bent over to standing position. She describes this as spinning. She reports she has noticed changes in her memory since the  accident and that she ?forgets things easily.? She reports her balance has worsened. Pt has not yet seen a neurologist. Pt reports her pain has significantly affected her ability to be active and perform her job duties. Pt formerly very active, enjoyed exercising. Her pain is affecting her sleep. She can no longer lie down on her L side without R side neck pain. She describes difficulties with turning and that her R low back ?feels like it wants to come apart.?   ? Limitations Sitting;Standing;Reading;Lifting;House hold activities;Walking   ? How long can you sit comfortably? Pt reports she is not comfortable still, she has to constantly change positions   ? How long can you stand comfortably? cannot stand still comfortably   ? How long can you walk comfortably? 4-5 hours   ? Diagnostic tests per chart "Cervical x-rays revealing mild multilevel spondylolysis and patient with symptoms of right cervical radiculitis.'; 08/08/21 DG cervical spine "IMPRESSION:  Mild degenerative change C5-C6, C6-C7, C7-T1. Small corticated bony  density noted along the anterior inferior aspect of the lower  endplate of C6. This may be related to degenerative change and or  old injury. No evidence of acute fracture or dislocation." MR LUMBAR SPINE 05/20/21 per chart: "IMPRESSION:  1. No spinal canal stenosis or neural foraminal narrowing.  2. Innumerable tiny cysts in the liver. These  could represent  biliary hamartomas but are incompletely evaluated. An ultrasound of  the liver is recommended."   ? Patient Stated Goals Pt would like to improve her pain. Pt would like to not have to take medicine for pain relief. Pt would like to be able to perform her job duties without pain.   ? Currently in Pain? Yes   ? Pain Score 4    ? Pain Location Back   ? Pain Orientation Right   ? Pain Descriptors / Indicators Aching;Sore   ? Pain Type Chronic pain   ? Pain Onset More than a month ago   ? Pain Frequency Constant   ? Aggravating Factors  remaining  in prolonged position   ? Pain Relieving Factors medication/stretch   ? Effect of Pain on Daily Activities decreased activity tolerance;   ? Multiple Pain Sites Yes   ? Pain Score 4   ? Pain Location Neck   ? Pain Orientation Right   ? Pain Descriptors / Indicators Aching;Sore   ? Pain Type Chronic pain   ? Pain Onset More than a month ago   ? Pain Frequency Intermittent   ? Aggravating Factors  worse with looking down/reading   ? Pain Relieving Factors looking up/stretch   ? Effect of Pain on Daily Activities decreased activity tolerance;   ? ?  ?  ? ?  ? ? ? ? ? [facility] PT Assessment - 11/20/21 0001   ? ?  ? Observation/Other Assessments  ? Focus on Therapeutic Outcomes (FOTO)  60% (no change from evaluation on 10/19/22)   ? Other Surveys  Neck Disability Index   ? Neck Disability Index  62%   ?  ? ROM / Strength  ? AROM / PROM / Strength Strength   ?  ? AROM  ? Cervical Flexion 40   ? Cervical Extension 40   ? Cervical - Right Side Bend 38   ? Cervical - Left Side Bend 40   ? Cervical - Right Rotation 60   ? Cervical - Left Rotation 55   ?  ? Strength  ? Strength Assessment Site Shoulder   ? Right/Left Shoulder Right;Left   ? Right Shoulder Flexion 4/5   ? Right Shoulder Extension 3/5   ? Right Shoulder ABduction 4-/5   ? Right Shoulder Internal Rotation 4/5   ? Right Shoulder External Rotation 4/5   ? Right Shoulder Horizontal ABduction 3/5   ? Left Shoulder Flexion 4/5   ? Left Shoulder Extension 3/5   ? Left Shoulder ABduction 4-/5   ? Left Shoulder Internal Rotation 4/5   ? Left Shoulder External Rotation 4/5   ? Left Shoulder Horizontal ABduction 3/5   ? ?  ?  ? ?  ? ? ?  ?  ?TREATMENT: ?  ?  ?Manual Therapy:  ?Patient Prone:  ?PT performed grade II-III PA mobs at T1-T8 10 sec bouts x2 reps each level with moderate discomfort and guarding at T4-T7 ?PT peformed soft tissue massage to bilateral thoracic paraspinals with moderate tightness and trigger points noted along left scapular paraspinals. She also has  significant tenderness in bilateral upper trap with increase guarding and tolerating minimal touch x10 min;  ? ?  ?Instructed patient in Lumbar ROM/strengthening:  ?Patient prone: ?BUE shoulder extension 1# x10 reps, required mod VCS for proper positioning, reports moderate difficulty and discomfort in shoulders; Patient continues to exhibit strain in cervical paraspinals with decreased ROM due to weakness and difficulty;  ?  BUE low row 1# x10 reps each UE with cues for proper positioning, reports difficulty with increased fatigue; ?BUE mid row 1# x10 reps BUE with moderate difficulty reported;  ?  ?Progressed to qped: ?Ardine Eng pose 20 sec hold x1 rep ? ?PT assessed progress towards goals, see above; ? ?Patient's condition has the potential to improve in response to therapy. Maximum improvement is yet to be obtained. The anticipated improvement is attainable and reasonable in a generally predictable time.  Patient reports adherence with HEP, she is still experiencing high levels of pain especially with housework and work tasks.  ? ? ? ? ? ? ? ? ? ? ? ? ? ? ? ? ? ? ? ? PT Education - 11/20/21 0815   ? ? Education Details exercise technique/positioning/strengthening   ? Person(s) Educated Patient   ? Methods Explanation;Verbal cues   ? Comprehension Verbalized understanding;Returned demonstration;Verbal cues required;Need further instruction   ? ?  ?  ? ?  ? ? ? PT Short Term Goals - 11/20/21 0816   ? ?  ? PT SHORT TERM GOAL #1  ? Title Patient will be independent in home exercise program to improve strength/mobility for better functional independence with ADLs.   ? Baseline 3/22: to be issued next 1-2 visits 4/24: independent and adherent;   ? Time 6   ? Period Weeks   ? Status Achieved   ? Target Date 11/29/21   ?  ? PT SHORT TERM GOAL #2  ? Title Patient will report a worst pain of 3/10 to improve tolerance with ADLs and reduced symptoms with work activities.   ? Baseline 3/22: worst pain 8/10, 4/24: worst pain:  7-8/10 in neck   ? Time 6   ? Period Weeks   ? Status Not Met   ? Target Date 11/29/21   ? ?  ?  ? ?  ? ? ? ? PT Long Term Goals - 11/20/21 0839   ? ?  ? PT LONG TERM GOAL #1  ? Title Patient will increase F

## 2021-11-22 ENCOUNTER — Ambulatory Visit: Payer: Medicaid Other

## 2021-11-27 ENCOUNTER — Ambulatory Visit: Payer: Medicaid Other | Attending: Orthopedic Surgery

## 2021-11-27 DIAGNOSIS — M6281 Muscle weakness (generalized): Secondary | ICD-10-CM | POA: Insufficient documentation

## 2021-11-27 DIAGNOSIS — M5441 Lumbago with sciatica, right side: Secondary | ICD-10-CM | POA: Insufficient documentation

## 2021-11-27 DIAGNOSIS — G8929 Other chronic pain: Secondary | ICD-10-CM | POA: Insufficient documentation

## 2021-11-27 DIAGNOSIS — M5386 Other specified dorsopathies, lumbar region: Secondary | ICD-10-CM | POA: Insufficient documentation

## 2021-11-27 DIAGNOSIS — M545 Low back pain, unspecified: Secondary | ICD-10-CM | POA: Diagnosis present

## 2021-11-27 DIAGNOSIS — M542 Cervicalgia: Secondary | ICD-10-CM | POA: Diagnosis present

## 2021-11-27 NOTE — Therapy (Signed)
[facility] ?[place], [place], [address] ?Phone: [phone]   Fax:  [phone] ? ?Physical Therapy Treatment ? ?Patient Details  ?Name: Sabrina Wolfe ?MRN: [id number] ?Date of Birth: 11-02-1980 ?Referring Provider (PT): Heron Sabins, [place] ? ? ?Encounter Date: 11/27/2021 ? ? PT End of Session - 11/27/21 0713   ? ? Visit Number 11   ? Number of Visits 25   ? Date for PT Re-Evaluation 01/10/22   ? PT Start Time 0715   ? PT Stop Time 0759   ? PT Time Calculation (min) 44 min   ? Activity Tolerance Patient tolerated treatment well   ? Behavior During Therapy [facility] for tasks assessed/performed   ? ?  ?  ? ?  ? ? ?History reviewed. No pertinent past medical history. ? ?Past Surgical History:  ?Procedure Laterality Date  ? CHOLECYSTECTOMY    ? 20 years ago  ? ? ?There were no vitals filed for this visit. ? ? Subjective Assessment - 11/27/21 0717   ? ? Subjective Patient reports her pain is down but starts to hurt when shes washing dishes or when moving her head quickly.   ? Patient is accompained by: Interpreter   ? Pertinent History Pt presents to PT evaluation with interpreter (pt primarily speaks [place]) Pt in [place] 12/25/2020. Previously seen at another PT clinic to address LBP (back pain did not resolve). Pt reports immediate LBP and neck pain following accident that has remained. Pt with difficulty keeping her neck in a neutral position or with bending her neck to look down due to pain. She feels her neck fatigues quicky. She feels this pain mostly on her R side. Pt has N/T in B hands and BLEs. She endorses headaches that ?wrap around? from occipital region to her L eye. Pt does feel dizzy at times when going from a bent over to standing position. She describes this as spinning. She reports she has noticed changes in her memory since the accident and that she ?forgets things easily.? She reports her balance has worsened. Pt has not  yet seen a neurologist. Pt reports her pain has significantly affected her ability to be active and perform her job duties. Pt formerly very active, enjoyed exercising. Her pain is affecting her sleep. She can no longer lie down on her L side without R side neck pain. She describes difficulties with turning and that her R low back ?feels like it wants to come apart.?   ? Limitations Sitting;Standing;Reading;Lifting;House hold activities;Walking   ? How long can you sit comfortably? Pt reports she is not comfortable still, she has to constantly change positions   ? How long can you stand comfortably? cannot stand still comfortably   ? How long can you walk comfortably? 4-5 hours   ? Diagnostic tests per chart "Cervical x-rays revealing mild multilevel spondylolysis and patient with symptoms of right cervical radiculitis.'; 08/08/21 DG cervical spine "IMPRESSION:  Mild degenerative change C5-C6, C6-C7, C7-T1. Small corticated bony  density noted along the anterior inferior aspect of the lower  endplate of C6. This may be related to degenerative change and or  old injury. No evidence of acute fracture or dislocation." MR LUMBAR SPINE 05/20/21 per chart: "IMPRESSION:  1. No spinal canal stenosis or neural foraminal narrowing.  2. Innumerable tiny cysts in the liver. These could represent  biliary hamartomas but are incompletely evaluated. An ultrasound of  the liver is recommended."   ?  Patient Stated Goals Pt would like to improve her pain. Pt would like to not have to take medicine for pain relief. Pt would like to be able to perform her job duties without pain.   ? Currently in Pain? Yes   ? Pain Score 3    ? Pain Location Neck   ? Pain Orientation Right;Left   ? Pain Descriptors / Indicators Aching   ? Pain Onset More than a month ago   ? ?  ?  ? ?  ? ? ? ?Patient reports her pain is down but starts to hurt when shes washing dishes or when moving her head quickly.  ? ? ?TREATMENT: ?  ?  ?Manual Therapy:  ?Patient  Prone:  ?PT performed grade II-III PA mobs at T1-T8 10 sec bouts x2 reps each level with moderate discomfort and guarding at T4-T7 ?Suboccipital release 30 seconds x2 trials  ?  ?Instructed patient in Lumbar ROM/strengthening:  ?Patient prone: ?Standing row cable machine #12.5 2 sets of 10x ?Bent single arm row on table 5lb dumbbell 10x each UE ?Wall abduction terminated due to pain in R upper trap ?On half foam roller:  ?-robber stretch 30 seconds x2 trials ?-RTB overhead Y 15x ?-RTB ER 15x ? ?Trigger Point Dry Needling (TDN), unbilled ?Education performed with patient regarding potential benefit of TDN. Reviewed precautions and risks with patient. Reviewed special precautions/risks over lung fields which include pneumothorax. Reviewed signs and symptoms of pneumothorax and advised pt to go to ER immediately if these symptoms develop advise them of dry needling treatment. Extensive time spent with pt to ensure full understanding of TDN risks. Pt provided verbal consent to treatment. TDN performed to  with 0.3 x 30 single needle placements with local twitch response (LTR). Pistoning technique utilized. Improved pain-free motion following intervention. Muscles targeted L cervical paraspinals, L and R upper traps, x 10 minutes ?  ? ? ?Patient presents with excellence motivation. Her pain continues to be present but only with certain chores and quick movements now. Education on return to gym exercises including rows tolerated well. She does continue to have guarding present of upper trap region that is reduced with TDN. Significant large trigger points released this session. Patient will continue to benefit from skilled physical therapy to improve strength, mobility, and decrease pain with ADLs. ? ? ? ? ? ? ? ? ? ? ? ? ? ? ? ? ? ? ? ? PT Education - 11/27/21 0713   ? ? Education Details exercise technique, body mechanics, TDN   ? Person(s) Educated Patient   ? Methods Explanation;Demonstration;Tactile cues;Verbal cues    ? Comprehension Verbalized understanding;Returned demonstration;Verbal cues required;Tactile cues required   ? ?  ?  ? ?  ? ? ? PT Short Term Goals - 11/20/21 0816   ? ?  ? PT SHORT TERM GOAL #1  ? Title Patient will be independent in home exercise program to improve strength/mobility for better functional independence with ADLs.   ? Baseline 3/22: to be issued next 1-2 visits 4/24: independent and adherent;   ? Time 6   ? Period Weeks   ? Status Achieved   ? Target Date 11/29/21   ?  ? PT SHORT TERM GOAL #2  ? Title Patient will report a worst pain of 3/10 to improve tolerance with ADLs and reduced symptoms with work activities.   ? Baseline 3/22: worst pain 8/10, 4/24: worst pain: 7-8/10 in neck   ? Time 6   ?  Period Weeks   ? Status Not Met   ? Target Date 11/29/21   ? ?  ?  ? ?  ? ? ? ? PT Long Term Goals - 11/20/21 0839   ? ?  ? PT LONG TERM GOAL #1  ? Title Patient will increase FOTO score to equal to or greater than 71  to demonstrate statistically significant improvement in mobility and quality of life.   ? Baseline 3/22: 60, 4/24: 60%   ? Time 12   ? Period Weeks   ? Status Not Met   ? Target Date 01/10/22   ?  ? PT LONG TERM GOAL #2  ? Title Patient will increase BUE gross strength and gross strength of cervical musculature to at least a 4+/5 to improve ADL ability and QOL.   ? Baseline 3/22: UE strength grossly 4/5 B, cervical musculature grossly  4-/5, 4/24: see flowsheet   ? Time 12   ? Period Weeks   ? Status Not Met   ? Target Date 01/10/22   ?  ? PT LONG TERM GOAL #3  ? Title Pt will demonstrate 15 degree increase in cervical extension ROM and B cervical rotation ROM that is pain free for improved functional mobility and QOL.   ? Baseline 3/22: ext 40 deg and painful, rotation R 60 deg and painful, rotation L 40 deg and painful, 4/24: see flowsheet   ? Time 12   ? Period Weeks   ? Status Not Met   ? Target Date 01/10/22   ?  ? PT LONG TERM GOAL #4  ? Title Patient will reduce Neck Disability Index  score to <10% to demonstrate minimal disability with ADL?s including improved sleeping tolerance, sitting tolerance, etc for better mobility at home and work.   ? Baseline 3/22: to be completed future sess

## 2021-11-29 ENCOUNTER — Ambulatory Visit: Payer: Medicaid Other

## 2021-11-30 ENCOUNTER — Ambulatory Visit: Payer: Medicaid Other

## 2021-12-04 ENCOUNTER — Ambulatory Visit: Payer: Medicaid Other

## 2021-12-04 DIAGNOSIS — M542 Cervicalgia: Secondary | ICD-10-CM | POA: Diagnosis not present

## 2021-12-04 DIAGNOSIS — M6281 Muscle weakness (generalized): Secondary | ICD-10-CM

## 2021-12-04 DIAGNOSIS — M5441 Lumbago with sciatica, right side: Secondary | ICD-10-CM

## 2021-12-04 DIAGNOSIS — M545 Low back pain, unspecified: Secondary | ICD-10-CM

## 2021-12-04 NOTE — Therapy (Signed)
[place] [facility] ?[place], [place], [address] ?Phone: [phone]   Fax:  [phone] ? ?Physical Therapy Treatment ? ?Patient Details  ?Name: Sabrina Wolfe ?MRN: [id number] ?Date of Birth: [birth year] ?Referring Provider (PT): Heron Sabins, [place] ? ? ?Encounter Date: 12/04/2021 ? ? PT End of Session - 12/04/21 0826   ? ? Visit Number 12   ? Number of Visits 25   ? Date for PT Re-Evaluation 01/10/22   ? PT Start Time [phone]   ? PT Stop Time 9628   ? PT Time Calculation (min) 42 min   ? Activity Tolerance Patient tolerated treatment well   ? Behavior During Therapy [facility] for tasks assessed/performed   ? ?  ?  ? ?  ? ? ?History reviewed. No pertinent past medical history. ? ?Past Surgical History:  ?Procedure Laterality Date  ? CHOLECYSTECTOMY    ? 20 years ago  ? ? ?There were no vitals filed for this visit. ? ? Subjective Assessment - 12/04/21 0824   ? ? Subjective Patient reports her pain continues to be present at night and when washing the dishes.   ? Patient is accompained by: Interpreter   ? Pertinent History Pt presents to PT evaluation with interpreter (pt primarily speaks [place]) Pt in [place] 12/25/2020. Previously seen at another PT clinic to address LBP (back pain did not resolve). Pt reports immediate LBP and neck pain following accident that has remained. Pt with difficulty keeping her neck in a neutral position or with bending her neck to look down due to pain. She feels her neck fatigues quicky. She feels this pain mostly on her R side. Pt has N/T in B hands and BLEs. She endorses headaches that ?wrap around? from occipital region to her L eye. Pt does feel dizzy at times when going from a bent over to standing position. She describes this as spinning. She reports she has noticed changes in her memory since the accident and that she ?forgets things easily.? She reports her balance has worsened. Pt has not yet seen a neurologist.  Pt reports her pain has significantly affected her ability to be active and perform her job duties. Pt formerly very active, enjoyed exercising. Her pain is affecting her sleep. She can no longer lie down on her L side without R side neck pain. She describes difficulties with turning and that her R low back ?feels like it wants to come apart.?   ? Limitations Sitting;Standing;Reading;Lifting;House hold activities;Walking   ? How long can you sit comfortably? Pt reports she is not comfortable still, she has to constantly change positions   ? How long can you stand comfortably? cannot stand still comfortably   ? How long can you walk comfortably? 4-5 hours   ? Diagnostic tests per chart "Cervical x-rays revealing mild multilevel spondylolysis and patient with symptoms of right cervical radiculitis.'; 08/08/21 DG cervical spine "IMPRESSION:  Mild degenerative change C5-C6, C6-C7, C7-T1. Small corticated bony  density noted along the anterior inferior aspect of the lower  endplate of C6. This may be related to degenerative change and or  old injury. No evidence of acute fracture or dislocation." MR LUMBAR SPINE 05/20/21 per chart: "IMPRESSION:  1. No spinal canal stenosis or neural foraminal narrowing.  2. Innumerable tiny cysts in the liver. These could represent  biliary hamartomas but are incompletely evaluated. An ultrasound of  the liver is recommended."   ? Patient Stated Goals Pt would  like to improve her pain. Pt would like to not have to take medicine for pain relief. Pt would like to be able to perform her job duties without pain.   ? Currently in Pain? Yes   ? Pain Score 4    ? Pain Location Neck   ? Pain Orientation Right;Left   ? Pain Descriptors / Indicators Aching   ? Pain Type Chronic pain   ? Pain Onset More than a month ago   ? Pain Frequency Intermittent   ? ?  ?  ? ?  ? ? ? ? ? ?  ?TREATMENT: ?  ?  ?Manual Therapy:  ?Patient Prone:  ?PT performed grade II-III PA mobs at T1-T8 10 sec bouts x2 reps each  level with moderate discomfort and guarding at T4-T7 ?Grade II mobilizations to cervical spine x 3 minutes ?Suboccipital release 30 seconds x2 trials  ?  ?Instructed patient in Lumbar ROM/strengthening:  ?Patient prone: ?Scapular retraction with cervical rotation 10x 5 second holds each direction ?Towel distraction with cervical extension 10x 5 second holds  ?Chin tucks 10x5 second holds ?Seated upper trap stretch hold 30 seconds each side  ?Standing row cable machine #12.5 2 sets of 10x ?Bent single arm row on table 5lb dumbbell 10x each UE ?On half foam roller:  ?-robber stretch 30 seconds x2 trials ?-RTB overhead Y 15x ?-RTB ER 15x ?  ?Trigger Point Dry Needling (TDN), unbilled ?Education performed with patient regarding potential benefit of TDN. Reviewed precautions and risks with patient. Reviewed special precautions/risks over lung fields which include pneumothorax. Reviewed signs and symptoms of pneumothorax and advised pt to go to ER immediately if these symptoms develop advise them of dry needling treatment. Extensive time spent with pt to ensure full understanding of TDN risks. Pt provided verbal consent to treatment. TDN performed to  with 0.3 x 30 single needle placements with local twitch response (LTR). Pistoning technique utilized. Improved pain-free motion following intervention. Muscles targeted L cervical paraspinals, L and R upper traps, x 4  minutes ? ? ? ? ? ?Patient will be decreased down to 1x/week POC due to limited visit allowed by insurance. Multiple large trigger points released with TDN in bilateral upper trap and levator scap region. Patient is highly motivated for pain reduction. Cervical lengthening interventions improved with repetition. Patient will continue to benefit from skilled physical therapy to improve strength, mobility, and decrease pain with ADLs ? ? ? ? ? ? ? ? ? ? ? ? ? ? ? ? PT Education - 12/04/21 0826   ? ? Education Details exercise technique, body mechanics, TDN   ?  Person(s) Educated Patient   ? Methods Explanation;Demonstration;Tactile cues;Verbal cues   ? Comprehension Verbalized understanding;Returned demonstration;Verbal cues required;Tactile cues required   ? ?  ?  ? ?  ? ? ? PT Short Term Goals - 11/20/21 0816   ? ?  ? PT SHORT TERM GOAL #1  ? Title Patient will be independent in home exercise program to improve strength/mobility for better functional independence with ADLs.   ? Baseline 3/22: to be issued next 1-2 visits 4/24: independent and adherent;   ? Time 6   ? Period Weeks   ? Status Achieved   ? Target Date 11/29/21   ?  ? PT SHORT TERM GOAL #2  ? Title Patient will report a worst pain of 3/10 to improve tolerance with ADLs and reduced symptoms with work activities.   ? Baseline 3/22: worst pain 8/10,  4/24: worst pain: 7-8/10 in neck   ? Time 6   ? Period Weeks   ? Status Not Met   ? Target Date 11/29/21   ? ?  ?  ? ?  ? ? ? ? PT Long Term Goals - 11/20/21 0839   ? ?  ? PT LONG TERM GOAL #1  ? Title Patient will increase FOTO score to equal to or greater than 71  to demonstrate statistically significant improvement in mobility and quality of life.   ? Baseline 3/22: 60, 4/24: 60%   ? Time 12   ? Period Weeks   ? Status Not Met   ? Target Date 01/10/22   ?  ? PT LONG TERM GOAL #2  ? Title Patient will increase BUE gross strength and gross strength of cervical musculature to at least a 4+/5 to improve ADL ability and QOL.   ? Baseline 3/22: UE strength grossly 4/5 B, cervical musculature grossly  4-/5, 4/24: see flowsheet   ? Time 12   ? Period Weeks   ? Status Not Met   ? Target Date 01/10/22   ?  ? PT LONG TERM GOAL #3  ? Title Pt will demonstrate 15 degree increase in cervical extension ROM and B cervical rotation ROM that is pain free for improved functional mobility and QOL.   ? Baseline 3/22: ext 40 deg and painful, rotation R 60 deg and painful, rotation L 40 deg and painful, 4/24: see flowsheet   ? Time 12   ? Period Weeks   ? Status Not Met   ? Target  Date 01/10/22   ?  ? PT LONG TERM GOAL #4  ? Title Patient will reduce Neck Disability Index score to <10% to demonstrate minimal disability with ADL?s including improved sleeping tolerance, sitting tole

## 2021-12-06 ENCOUNTER — Ambulatory Visit: Payer: Medicaid Other

## 2021-12-11 ENCOUNTER — Ambulatory Visit: Payer: Medicaid Other

## 2021-12-11 DIAGNOSIS — M542 Cervicalgia: Secondary | ICD-10-CM

## 2021-12-11 DIAGNOSIS — M545 Low back pain, unspecified: Secondary | ICD-10-CM

## 2021-12-11 DIAGNOSIS — M6281 Muscle weakness (generalized): Secondary | ICD-10-CM

## 2021-12-11 NOTE — Therapy (Signed)
[place] [facility] ?[place], [place], [address] ?Phone: [phone]   Fax:  [phone] ? ?Physical Therapy Treatment ? ?Patient Details  ?Name: Sabrina Wolfe ?MRN: [id number] ?Date of Birth: 13-Oct-1980 ?Referring Provider (PT): Heron Sabins, [place] ? ? ?Encounter Date: 12/11/2021 ? ? PT End of Session - 12/11/21 4332   ? ? Visit Number 13   ? Number of Visits 25   ? Date for PT Re-Evaluation 01/10/22   ? PT Start Time 0809   ? PT Stop Time 0845   ? PT Time Calculation (min) 36 min   ? Activity Tolerance Patient tolerated treatment well   ? Behavior During Therapy [facility] for tasks assessed/performed   ? ?  ?  ? ?  ? ? ?History reviewed. No pertinent past medical history. ? ?Past Surgical History:  ?Procedure Laterality Date  ? CHOLECYSTECTOMY    ? 20 years ago  ? ? ?There were no vitals filed for this visit. ? ? Subjective Assessment - 12/11/21 0823   ? ? Subjective Patient reports her pain has bothered her more over the past few days. Reports compliance with HEP every day.   ? Patient is accompained by: Interpreter   ? Pertinent History Pt presents to PT evaluation with interpreter (pt primarily speaks [place]) Pt in [place] 12/25/2020. Previously seen at another PT clinic to address LBP (back pain did not resolve). Pt reports immediate LBP and neck pain following accident that has remained. Pt with difficulty keeping her neck in a neutral position or with bending her neck to look down due to pain. She feels her neck fatigues quicky. She feels this pain mostly on her R side. Pt has N/T in B hands and BLEs. She endorses headaches that ?wrap around? from occipital region to her L eye. Pt does feel dizzy at times when going from a bent over to standing position. She describes this as spinning. She reports she has noticed changes in her memory since the accident and that she ?forgets things easily.? She reports her balance has worsened. Pt has not  yet seen a neurologist. Pt reports her pain has significantly affected her ability to be active and perform her job duties. Pt formerly very active, enjoyed exercising. Her pain is affecting her sleep. She can no longer lie down on her L side without R side neck pain. She describes difficulties with turning and that her R low back ?feels like it wants to come apart.?   ? Limitations Sitting;Standing;Reading;Lifting;House hold activities;Walking   ? How long can you sit comfortably? Pt reports she is not comfortable still, she has to constantly change positions   ? How long can you stand comfortably? cannot stand still comfortably   ? How long can you walk comfortably? 4-5 hours   ? Diagnostic tests per chart "Cervical x-rays revealing mild multilevel spondylolysis and patient with symptoms of right cervical radiculitis.'; 08/08/21 DG cervical spine "IMPRESSION:  Mild degenerative change C5-C6, C6-C7, C7-T1. Small corticated bony  density noted along the anterior inferior aspect of the lower  endplate of C6. This may be related to degenerative change and or  old injury. No evidence of acute fracture or dislocation." MR LUMBAR SPINE 05/20/21 per chart: "IMPRESSION:  1. No spinal canal stenosis or neural foraminal narrowing.  2. Innumerable tiny cysts in the liver. These could represent  biliary hamartomas but are incompletely evaluated. An ultrasound of  the liver is recommended."   ? Patient  Stated Goals Pt would like to improve her pain. Pt would like to not have to take medicine for pain relief. Pt would like to be able to perform her job duties without pain.   ? Currently in Pain? Yes   ? Pain Score 3    ? Pain Location Neck   ? Pain Orientation Right;Left   ? Pain Descriptors / Indicators Aching   ? Pain Type Chronic pain   ? Pain Onset More than a month ago   ? Pain Frequency Intermittent   ? ?  ?  ? ?  ? ? ? ? ?TREATMENT: ?  ?  ?Manual Therapy:  ?Patient Prone:  ?PT performed grade II-III PA mobs at T1-T8 10 sec  bouts x2 reps each level with moderate discomfort and guarding at T4-T7 ?Grade II mobilizations to cervical spine x 3 minutes ?Suboccipital release 30 seconds x2 trials  ?  ?TherEx ?Scapular retraction with cervical rotation 10x 5 second holds each direction ?Chin tucks 10x5 second holds ?Seated upper trap stretch hold 30 seconds each side  ?Standing row cable machine #12.5 2 sets of 10x ?Towel side bend with distraction seated 2x 30 seconds each direction  ?GTB overhead Y 10x ; 2 sets  ?Robber stretch 30 seconds  ?Pectoral stretch from arnold position to robber stretch 10 ?  ?Trigger Point Dry Needling (TDN), unbilled ?Education performed with patient regarding potential benefit of TDN. Reviewed precautions and risks with patient. Reviewed special precautions/risks over lung fields which include pneumothorax. Reviewed signs and symptoms of pneumothorax and advised pt to go to ER immediately if these symptoms develop advise them of dry needling treatment. Extensive time spent with pt to ensure full understanding of TDN risks. Pt provided verbal consent to treatment. TDN performed to  with 0.3 x 30 single needle placements with local twitch response (LTR). Pistoning technique utilized. Improved pain-free motion following intervention. Muscles targeted L cervical paraspinals, L and R upper traps, x 6  minutes ?  ? Pt educated throughout session about proper posture and technique with exercises. Improved exercise technique, movement at target joints, use of target muscles after min to mod verbal, visual, tactile cues. ? ?Patient session limited by late arrival. Patient reports her neck gets tired by the end of the day and during work. Has multiple trigger points in bilateral upper traps. Patient has frequent regression of shoulder elevation requiring external cueing.  Patient will continue to benefit from skilled physical therapy to improve strength, mobility, and decrease pain with  ADLs ? ? ? ? ? ? ? ? ? ? ? ? ? ? ? ? ? ? ? ? ? ? ? PT Education - 12/11/21 0824   ? ? Education Details exercise technique, body mechanics, TDN   ? Person(s) Educated Patient   ? Methods Explanation;Demonstration;Tactile cues;Verbal cues   ? Comprehension Verbalized understanding;Returned demonstration;Verbal cues required;Tactile cues required   ? ?  ?  ? ?  ? ? ? PT Short Term Goals - 11/20/21 0816   ? ?  ? PT SHORT TERM GOAL #1  ? Title Patient will be independent in home exercise program to improve strength/mobility for better functional independence with ADLs.   ? Baseline 3/22: to be issued next 1-2 visits 4/24: independent and adherent;   ? Time 6   ? Period Weeks   ? Status Achieved   ? Target Date 11/29/21   ?  ? PT SHORT TERM GOAL #2  ? Title Patient will report a worst  pain of 3/10 to improve tolerance with ADLs and reduced symptoms with work activities.   ? Baseline 3/22: worst pain 8/10, 4/24: worst pain: 7-8/10 in neck   ? Time 6   ? Period Weeks   ? Status Not Met   ? Target Date 11/29/21   ? ?  ?  ? ?  ? ? ? ? PT Long Term Goals - 11/20/21 0839   ? ?  ? PT LONG TERM GOAL #1  ? Title Patient will increase FOTO score to equal to or greater than 71  to demonstrate statistically significant improvement in mobility and quality of life.   ? Baseline 3/22: 60, 4/24: 60%   ? Time 12   ? Period Weeks   ? Status Not Met   ? Target Date 01/10/22   ?  ? PT LONG TERM GOAL #2  ? Title Patient will increase BUE gross strength and gross strength of cervical musculature to at least a 4+/5 to improve ADL ability and QOL.   ? Baseline 3/22: UE strength grossly 4/5 B, cervical musculature grossly  4-/5, 4/24: see flowsheet   ? Time 12   ? Period Weeks   ? Status Not Met   ? Target Date 01/10/22   ?  ? PT LONG TERM GOAL #3  ? Title Pt will demonstrate 15 degree increase in cervical extension ROM and B cervical rotation ROM that is pain free for improved functional mobility and QOL.   ? Baseline 3/22: ext 40 deg and  painful, rotation R 60 deg and painful, rotation L 40 deg and painful, 4/24: see flowsheet   ? Time 12   ? Period Weeks   ? Status Not Met   ? Target Date 01/10/22   ?  ? PT LONG TERM GOAL #4  ? Title Patient will reduce Neck Disability Inde

## 2021-12-13 ENCOUNTER — Ambulatory Visit: Payer: Medicaid Other

## 2021-12-18 ENCOUNTER — Ambulatory Visit: Payer: Medicaid Other

## 2021-12-18 DIAGNOSIS — M542 Cervicalgia: Secondary | ICD-10-CM | POA: Diagnosis not present

## 2021-12-18 DIAGNOSIS — M6281 Muscle weakness (generalized): Secondary | ICD-10-CM

## 2021-12-18 DIAGNOSIS — G8929 Other chronic pain: Secondary | ICD-10-CM

## 2021-12-18 DIAGNOSIS — M5441 Lumbago with sciatica, right side: Secondary | ICD-10-CM

## 2021-12-18 DIAGNOSIS — M5386 Other specified dorsopathies, lumbar region: Secondary | ICD-10-CM

## 2021-12-18 NOTE — Therapy (Signed)
[place] MAIN [facility] SERVICES [address] [place], [place], [address] Phone: [phone]   Fax:  [phone]  Physical Therapy Treatment  Patient Details  Name: Sabrina Wolfe MRN: [id number] Date of Birth: Aug 23, 1980 Referring Provider (PT): Heron Sabins, [place]   Encounter Date: 12/18/2021   PT End of Session - 12/18/21 0733     Visit Number 14    Number of Visits 25    Date for PT Re-Evaluation 01/10/22    Authorization Type Medicaid [employer] [place]-    PT Start Time [phone]    PT Stop Time 0830    PT Time Calculation (min) 40 min    Activity Tolerance Patient tolerated treatment well;No increased pain    Behavior During Therapy [facility] for tasks assessed/performed             No past medical history on file.  Past Surgical History:  Procedure Laterality Date   CHOLECYSTECTOMY     20 years ago    There were no vitals filed for this visit.   Subjective Assessment - 12/18/21 0728     Subjective Ptr doing ok today. She reports her pain is a3/10. She has a new medication from neurologist, but has not started it yet.    Pertinent History Pt presents to PT evaluation with interpreter (pt primarily speaks [place]) Pt in [place] 12/25/2020. Previously seen at another PT clinic to address LBP (back pain did not resolve). Pt reports immediate LBP and neck pain following accident that has remained. Pt with difficulty keeping her neck in a neutral position or with bending her neck to look down due to pain. She feels her neck fatigues quicky. She feels this pain mostly on her R side. Pt has N/T in B hands and BLEs. She endorses headaches that "wrap around" from occipital region to her L eye. Pt does feel dizzy at times when going from a bent over to standing position. She describes this as spinning. She reports she has noticed changes in her memory since the accident and that she "forgets things easily." She reports her balance has worsened.  Pt has not yet seen a neurologist. Pt reports her pain has significantly affected her ability to be active and perform her job duties. Pt formerly very active, enjoyed exercising. Her pain is affecting her sleep. She can no longer lie down on her L side without R side neck pain. She describes difficulties with turning and that her R low back "feels like it wants to come apart."    Diagnostic tests per chart "Cervical x-rays revealing mild multilevel spondylolysis and patient with symptoms of right cervical radiculitis.'; 08/08/21 DG cervical spine "IMPRESSION:  Mild degenerative change C5-C6, C6-C7, C7-T1. Small corticated bony  density noted along the anterior inferior aspect of the lower  endplate of C6. This may be related to degenerative change and or  old injury. No evidence of acute fracture or dislocation." MR LUMBAR SPINE 05/20/21 per chart: "IMPRESSION:  1. No spinal canal stenosis or neural foraminal narrowing.  2. Innumerable tiny cysts in the liver. These could represent  biliary hamartomas but are incompletely evaluated. An ultrasound of  the liver is recommended."    Patient Stated Goals Pt would like to improve her pain. Pt would like to not have to take medicine for pain relief. Pt would like to be able to perform her job duties without pain.    Currently in Pain? Yes    Pain Score 3  Pain Location --   neck pain bilat           TherEx Scapular retraction with cervical rotation 10x 5 second holds each direction Suboccipital release 30 seconds x2 trials (seated in chair)  Chin tucks 10x5 second holds Seated upper trap stretch hold 30 seconds each side  Standing row cable machine #12.5 2 sets of 10x Supine BUE GHJ ER, elbow at side 1x15  GTB overhead Y 1x10, 1x15   Hooklying goalpost/robbery stretch (2x30 seconds)      PT Short Term Goals - 11/20/21 0816       PT SHORT TERM GOAL #1   Title Patient will be independent in home exercise program to improve strength/mobility  for better functional independence with ADLs.    Baseline 3/22: to be issued next 1-2 visits 4/24: independent and adherent;    Time 6    Period Weeks    Status Achieved    Target Date 11/29/21      PT SHORT TERM GOAL #2   Title Patient will report a worst pain of 3/10 to improve tolerance with ADLs and reduced symptoms with work activities.    Baseline 3/22: worst pain 8/10, 4/24: worst pain: 7-8/10 in neck    Time 6    Period Weeks    Status Not Met    Target Date 11/29/21               PT Long Term Goals - 11/20/21 0839       PT LONG TERM GOAL #1   Title Patient will increase FOTO score to equal to or greater than 71  to demonstrate statistically significant improvement in mobility and quality of life.    Baseline 3/22: 60, 4/24: 60%    Time 12    Period Weeks    Status Not Met    Target Date 01/10/22      PT LONG TERM GOAL #2   Title Patient will increase BUE gross strength and gross strength of cervical musculature to at least a 4+/5 to improve ADL ability and QOL.    Baseline 3/22: UE strength grossly 4/5 B, cervical musculature grossly  4-/5, 4/24: see flowsheet    Time 12    Period Weeks    Status Not Met    Target Date 01/10/22      PT LONG TERM GOAL #3   Title Pt will demonstrate 15 degree increase in cervical extension ROM and B cervical rotation ROM that is pain free for improved functional mobility and QOL.    Baseline 3/22: ext 40 deg and painful, rotation R 60 deg and painful, rotation L 40 deg and painful, 4/24: see flowsheet    Time 12    Period Weeks    Status Not Met    Target Date 01/10/22      PT LONG TERM GOAL #4   Title Patient will reduce Neck Disability Index score to <10% to demonstrate minimal disability with ADL's including improved sleeping tolerance, sitting tolerance, etc for better mobility at home and work.    Baseline 3/22: to be completed future session    Time 12    Period Weeks    Status New    Target Date 01/10/22                    Plan - 12/18/21 0739     Clinical Impression Statement Pt arrives in 3/10 neck pain. Pt able to perform entire session without pain  exacerbation, although some positional modifications need be made. Pt does well with intermittent tactile and verbal cues for head position. No major changes to home program.    Personal Factors and Comorbidities Time since onset of injury/illness/exacerbation;Sex;Profession;Comorbidity 1    Comorbidities chronic back pain    Examination-Activity Limitations Bend;Locomotion Level;Hygiene/Grooming;Stairs;Stand;Sit;Sleep;Squat;Lift    Examination-Participation Restrictions Yard Work;Shop;Occupation;Community Activity;Cleaning;Meal Prep;Laundry    Stability/Clinical Decision Making Evolving/Moderate complexity    Clinical Decision Making Moderate    Rehab Potential Good    PT Frequency 2x / week    PT Duration 12 weeks    PT Treatment/Interventions ADLs/Self Care Home Management;Canalith Repostioning;Cryotherapy;Electrical Stimulation;Iontophoresis 75m/ml Dexamethasone;Moist Heat;Traction;Ultrasound;DME Instruction;Gait training;Stair training;Functional mobility training;Therapeutic activities;Therapeutic exercise;Balance training;Neuromuscular re-education;Patient/family education;Orthotic Fit/Training;Manual techniques;Passive range of motion;Dry needling;Energy conservation;Splinting;Taping;Vestibular;Visual/perceptual remediation/compensation;Joint Manipulations    Consulted and Agree with Plan of Care Patient             Patient will benefit from skilled therapeutic intervention in order to improve the following deficits and impairments:  Pain, Improper body mechanics, Impaired UE functional use, Impaired sensation, Hypomobility, Dizziness, Decreased strength, Decreased range of motion, Decreased activity tolerance, Postural dysfunction, Impaired flexibility, Difficulty walking, Increased muscle spasms, Decreased mobility, Decreased  balance  Visit Diagnosis: Cervicalgia  Chronic low back pain, unspecified back pain laterality, unspecified whether sciatica present  Muscle weakness (generalized)  Right-sided low back pain with right-sided sciatica, unspecified chronicity  Decreased range of motion of lumbar spine     Problem List There are no problems to display for this patient.  8:36 AM, 12/18/21 AEtta Grandchild PT, DPT Physical Therapist - [facility]- [phone]    BMc[place] PVirgin[place]5/22/2023, 8:13 AM  [place] [facility] [address] [address]Phone: [phone]  Fax:  [phone] Name: MTaelor WaymireMRN: [id number] of Birth: 5Feb 01, 1982

## 2021-12-20 ENCOUNTER — Ambulatory Visit: Payer: Medicaid Other

## 2021-12-22 IMAGING — US US ABDOMEN LIMITED
1 series · 15 of 25 positions shown · non-contrast
Comparison: MRI dated 05/22/2021.

CLINICAL DATA: Liver lesion seen on MRI.

EXAM:
ULTRASOUND ABDOMEN LIMITED RIGHT UPPER QUADRANT

[Series 1: us abdomen limited ruq · 15 of 39 slices shown]
[im 1/39]
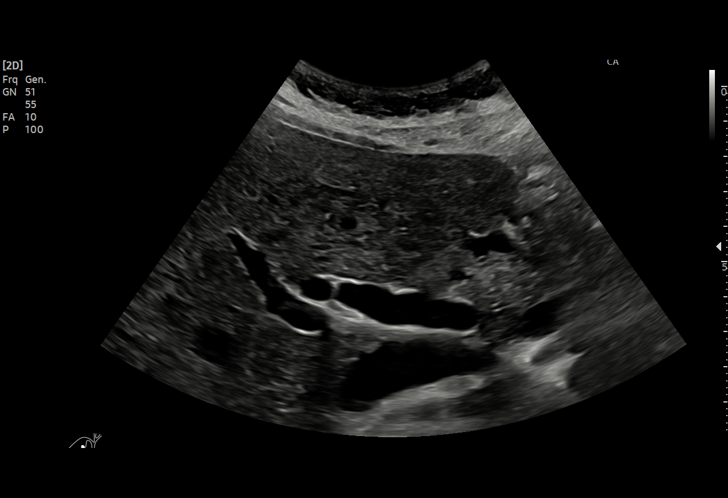
[im 4/39]
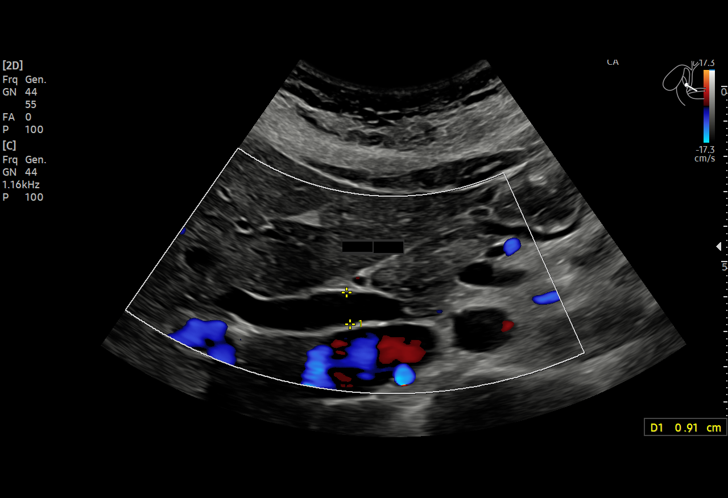
[im 7/39]
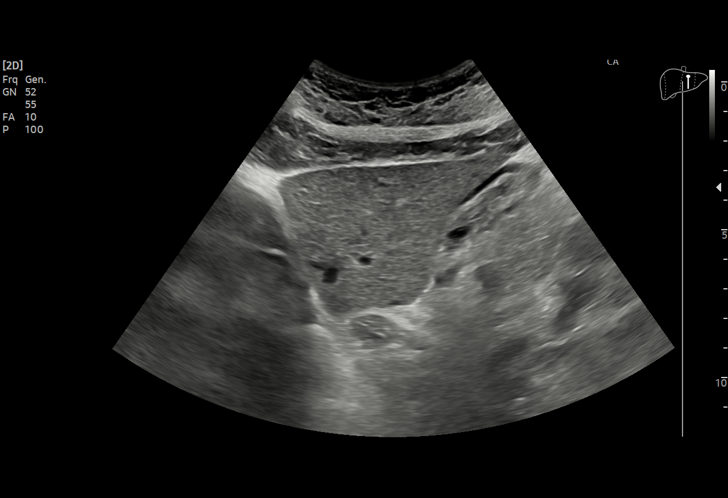
[im 8/39]
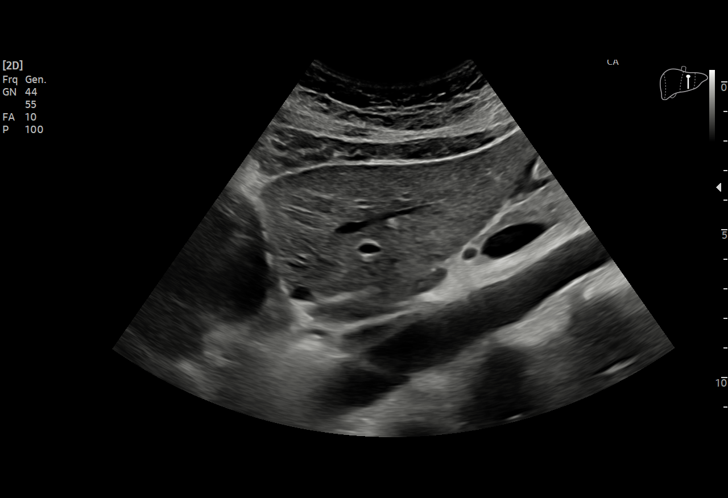
[im 12/39]
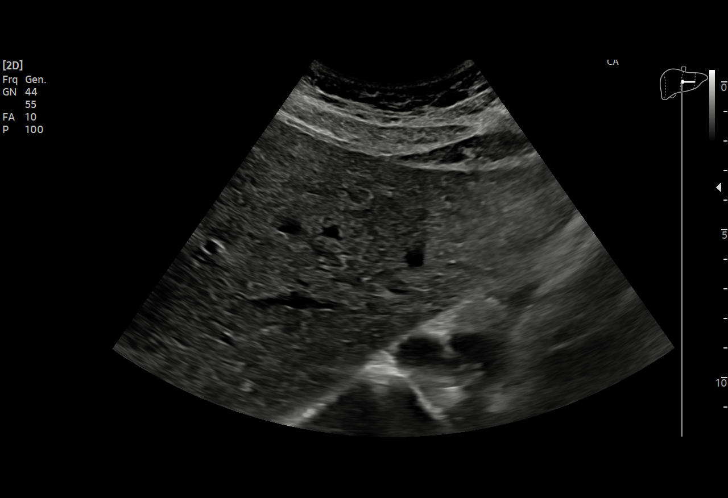
[im 15/39]
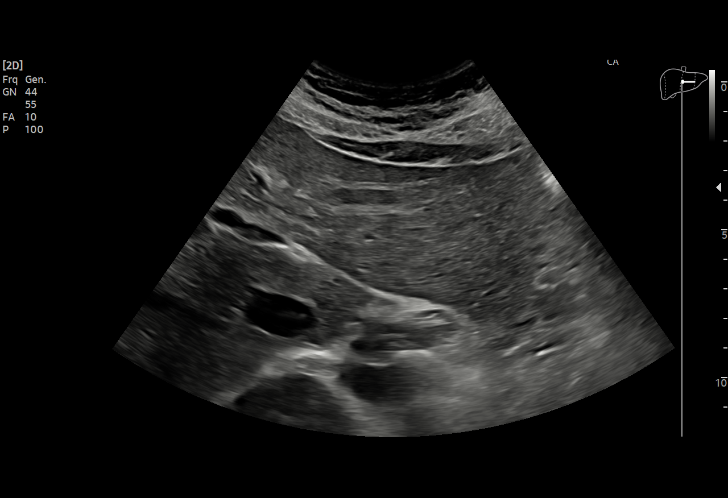
[im 16/39]
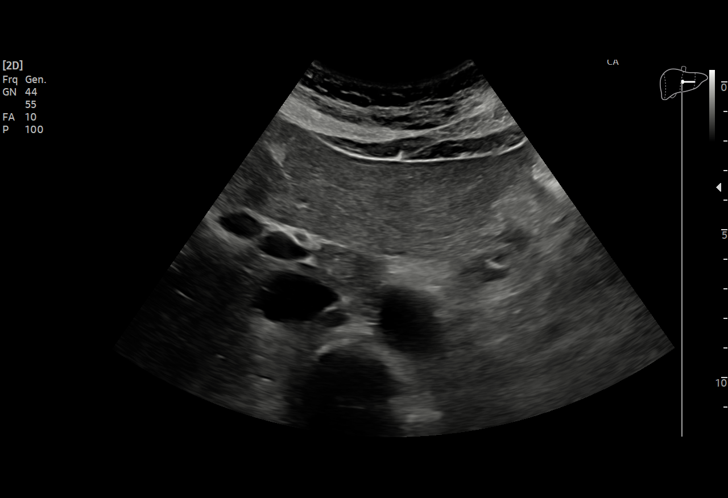
[im 20/39]
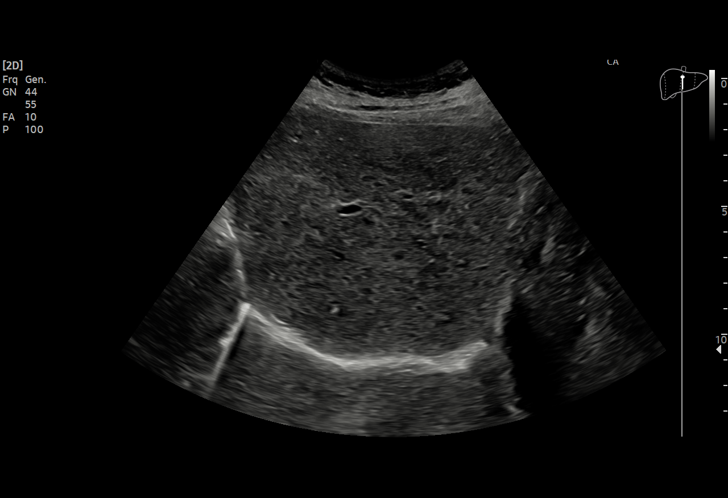
[im 23/39]
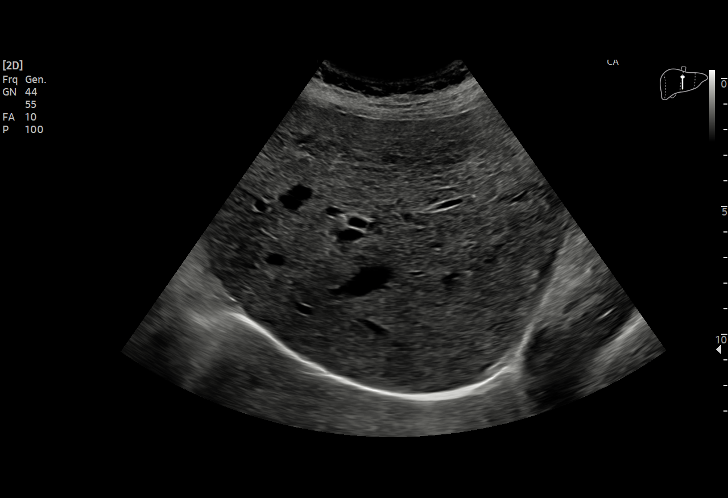
[im 24/39]
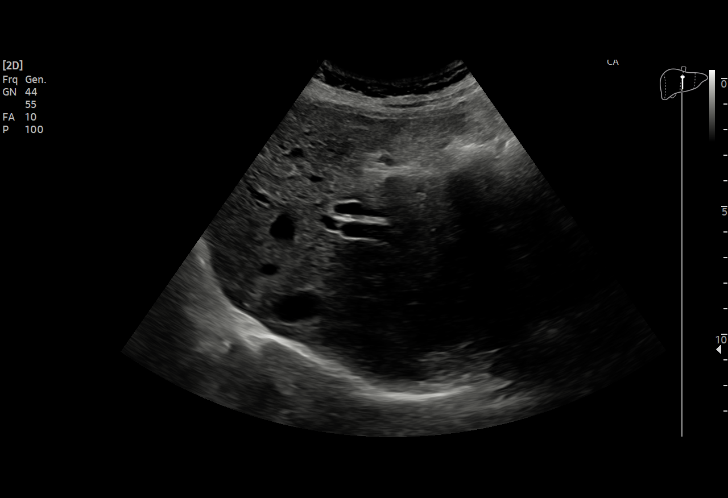
[im 27/39]
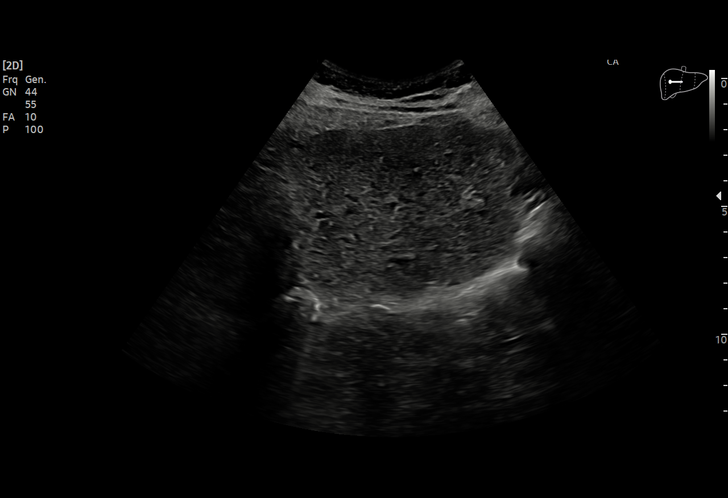
[im 31/39]
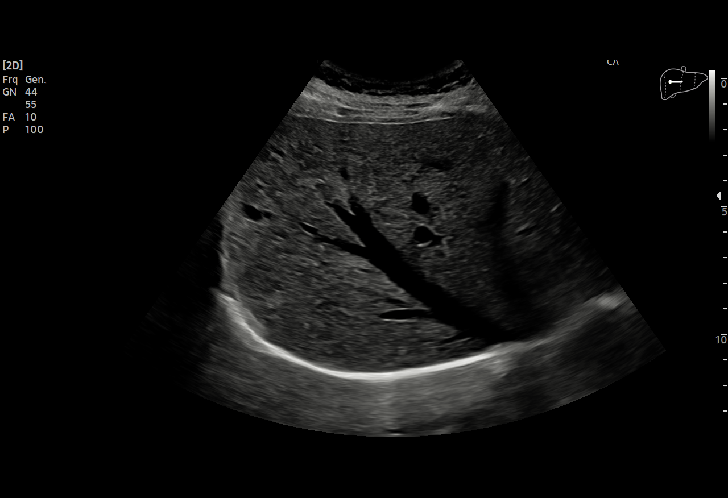
[im 32/39]
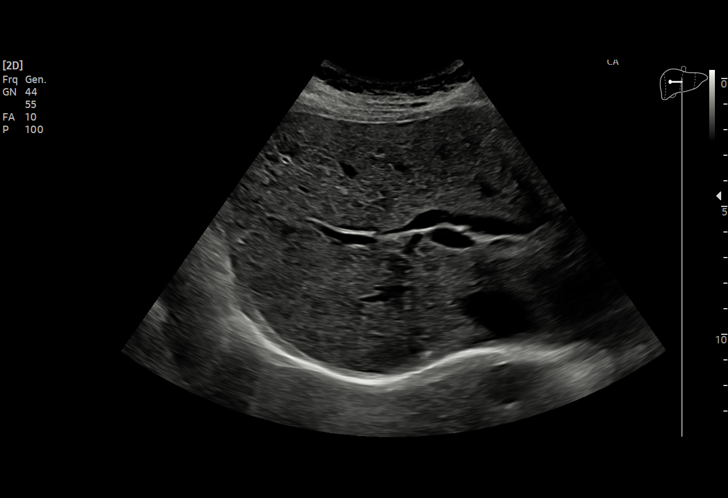
[im 35/39]
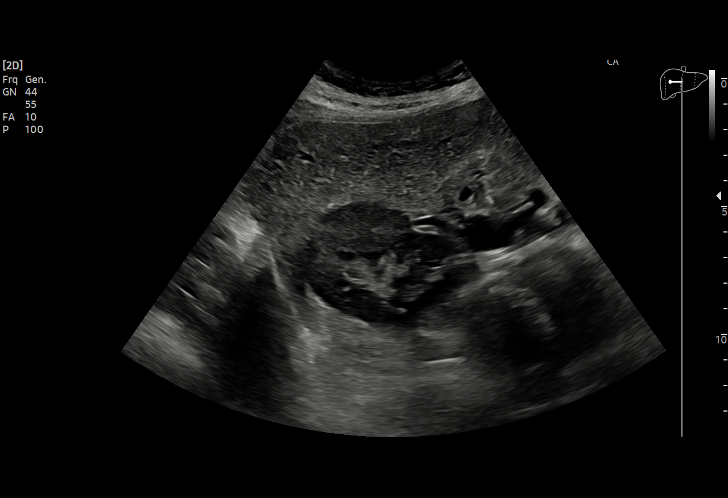
[im 39/39]
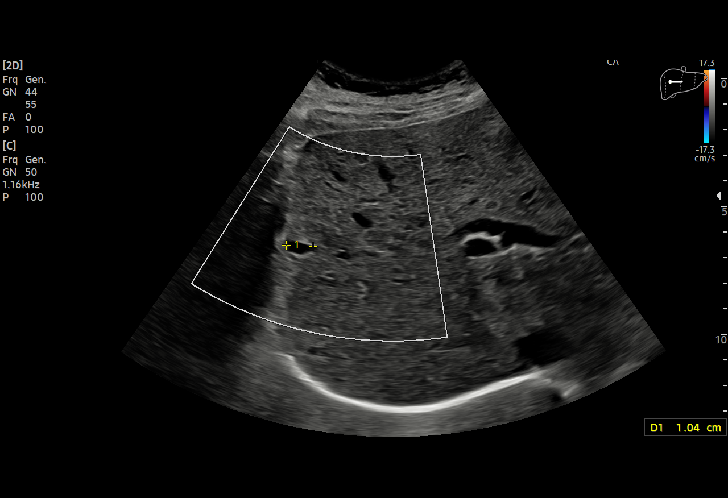

[15 of 25 positions shown; findings below may reference images not displayed]

FINDINGS: Gallbladder:

Cholecystectomy.

Common bile duct:

Diameter: 9 mm

Liver:

The liver demonstrates a coarsened echotexture with a heterogeneous
echogenicity. Several small simple appearing cysts noted in the
right lobe of the liver measuring up to 1 cm. Portal vein is patent
on color Doppler imaging with normal direction of blood flow towards
the liver.

Other: None.
IMPRESSION: 1. Several small right liver cysts.
2. Cholecystectomy.

## 2021-12-26 ENCOUNTER — Ambulatory Visit: Payer: Medicaid Other

## 2021-12-28 ENCOUNTER — Ambulatory Visit: Payer: Medicaid Other

## 2022-01-01 ENCOUNTER — Ambulatory Visit: Payer: Medicaid Other | Attending: Orthopedic Surgery

## 2022-01-01 DIAGNOSIS — M545 Low back pain, unspecified: Secondary | ICD-10-CM | POA: Diagnosis present

## 2022-01-01 DIAGNOSIS — M6281 Muscle weakness (generalized): Secondary | ICD-10-CM | POA: Diagnosis present

## 2022-01-01 DIAGNOSIS — M542 Cervicalgia: Secondary | ICD-10-CM | POA: Diagnosis present

## 2022-01-01 DIAGNOSIS — G8929 Other chronic pain: Secondary | ICD-10-CM | POA: Diagnosis present

## 2022-01-01 NOTE — Therapy (Signed)
[place] MAIN [facility] SERVICES [address] [place], [place], [address] Phone: [phone]   Fax:  [phone]  Physical Therapy Treatment  Patient Details  Name: Sabrina Wolfe MRN: [id number] Date of Birth: 12-Aug-1980 Referring Provider (PT): Heron Sabins, [place]   Encounter Date: 01/01/2022   PT End of Session - 01/01/22 0722     Visit Number 15    Number of Visits 25    Date for PT Re-Evaluation 01/10/22    Authorization Type Medicaid [employer] [place]-    PT Start Time [phone]    PT Stop Time 0815    PT Time Calculation (min) 47 min    Activity Tolerance Patient tolerated treatment well;No increased pain    Behavior During Therapy [facility] for tasks assessed/performed             History reviewed. No pertinent past medical history.  Past Surgical History:  Procedure Laterality Date   CHOLECYSTECTOMY     20 years ago    There were no vitals filed for this visit.   Subjective Assessment - 01/01/22 0750     Subjective Patient has increased confusion from her new medication, has missed sessions and appeared at wrong times due to this confusion. Reports she has less pain static position but continues to have pain with mopping and dishes. Pain went up to a 5/10 over the weekend.    Pertinent History Pt presents to PT evaluation with interpreter (pt primarily speaks [place]) Pt in [place] 12/25/2020. Previously seen at another PT clinic to address LBP (back pain did not resolve). Pt reports immediate LBP and neck pain following accident that has remained. Pt with difficulty keeping her neck in a neutral position or with bending her neck to look down due to pain. She feels her neck fatigues quicky. She feels this pain mostly on her R side. Pt has N/T in B hands and BLEs. She endorses headaches that "wrap around" from occipital region to her L eye. Pt does feel dizzy at times when going from a bent over to standing position. She describes  this as spinning. She reports she has noticed changes in her memory since the accident and that she "forgets things easily." She reports her balance has worsened. Pt has not yet seen a neurologist. Pt reports her pain has significantly affected her ability to be active and perform her job duties. Pt formerly very active, enjoyed exercising. Her pain is affecting her sleep. She can no longer lie down on her L side without R side neck pain. She describes difficulties with turning and that her R low back "feels like it wants to come apart."    How long can you sit comfortably? Pt reports she is not comfortable still, she has to constantly change positions    How long can you stand comfortably? cannot stand still comfortably    How long can you walk comfortably? 4-5 hours    Diagnostic tests per chart "Cervical x-rays revealing mild multilevel spondylolysis and patient with symptoms of right cervical radiculitis.'; 08/08/21 DG cervical spine "IMPRESSION:  Mild degenerative change C5-C6, C6-C7, C7-T1. Small corticated bony  density noted along the anterior inferior aspect of the lower  endplate of C6. This may be related to degenerative change and or  old injury. No evidence of acute fracture or dislocation." MR LUMBAR SPINE 05/20/21 per chart: "IMPRESSION:  1. No spinal canal stenosis or neural foraminal narrowing.  2. Innumerable tiny cysts in the liver. These  could represent  biliary hamartomas but are incompletely evaluated. An ultrasound of  the liver is recommended."    Patient Stated Goals Pt would like to improve her pain. Pt would like to not have to take medicine for pain relief. Pt would like to be able to perform her job duties without pain.    Currently in Pain? Yes    Pain Score 3     Pain Location Neck    Pain Orientation Right;Left    Pain Descriptors / Indicators Aching    Pain Frequency Intermittent                TREATMENT:     Manual Therapy:  Patient Prone:  PT performed  grade II-III PA mobs at T1-T8 10 sec bouts x2 reps each level with moderate discomfort and guarding at T4-T7 Grade II mobilizations to cervical spine x 3 minutes Suboccipital release 30 seconds x2 trials    TherEx Scapular retraction with cervical rotation 10x 5 second holds each direction Chin tucks 10x5 second holds Seated upper trap stretch hold 30 seconds each side  Standing row cable machine #12.5 2 sets of 10x; x 2 sets  Towel side bend with distraction seated 2x 30 seconds each direction  GTB overhead Y 10x ; 2 sets  Standing wall chin tucks with scapular retraction 5x 10 second holds  On half foam roller supine: -Robber stretch 30 seconds  -Pectoral stretch from arnold position to [employer] stretch 10   Trigger Point Dry Needling (TDN), unbilled Education performed with patient regarding potential benefit of TDN. Reviewed precautions and risks with patient. Reviewed special precautions/risks over lung fields which include pneumothorax. Reviewed signs and symptoms of pneumothorax and advised pt to go to ER immediately if these symptoms develop advise them of dry needling treatment. Extensive time spent with pt to ensure full understanding of TDN risks. Pt provided verbal consent to treatment. TDN performed to  with 0.3 x 30 single needle placements with local twitch response (LTR). Pistoning technique utilized. Improved pain-free motion following intervention. Muscles targeted L cervical paraspinals, L and R upper traps, x 6  minutes    Pt educated throughout session about proper posture and technique with exercises. Improved exercise technique, movement at target joints, use of target muscles after min to mod verbal, visual, tactile cues.  Patient's recent increase in no-shows and arrivals at incorrect times discussed. Patient reports increased confusion and depression with medication, educated on need to call physician, patient agreeable to this plan. Multiple large trigger points released  in bilateral upper traps this session. Patient will continue to benefit from skilled physical therapy to improve strength, mobility, and decrease pain with ADLs                      PT Education - 01/01/22 0722     Education Details exercise technique, body mechanics, TDN    Person(s) Educated Patient    Methods Explanation;Demonstration;Tactile cues;Verbal cues    Comprehension Verbalized understanding;Returned demonstration;Verbal cues required;Tactile cues required              PT Short Term Goals - 11/20/21 0816       PT SHORT TERM GOAL #1   Title Patient will be independent in home exercise program to improve strength/mobility for better functional independence with ADLs.    Baseline 3/22: to be issued next 1-2 visits 4/24: independent and adherent;    Time 6    Period Weeks    Status  Achieved    Target Date 11/29/21      PT SHORT TERM GOAL #2   Title Patient will report a worst pain of 3/10 to improve tolerance with ADLs and reduced symptoms with work activities.    Baseline 3/22: worst pain 8/10, 4/24: worst pain: 7-8/10 in neck    Time 6    Period Weeks    Status Not Met    Target Date 11/29/21               PT Long Term Goals - 11/20/21 0839       PT LONG TERM GOAL #1   Title Patient will increase FOTO score to equal to or greater than 71  to demonstrate statistically significant improvement in mobility and quality of life.    Baseline 3/22: 60, 4/24: 60%    Time 12    Period Weeks    Status Not Met    Target Date 01/10/22      PT LONG TERM GOAL #2   Title Patient will increase BUE gross strength and gross strength of cervical musculature to at least a 4+/5 to improve ADL ability and QOL.    Baseline 3/22: UE strength grossly 4/5 B, cervical musculature grossly  4-/5, 4/24: see flowsheet    Time 12    Period Weeks    Status Not Met    Target Date 01/10/22      PT LONG TERM GOAL #3   Title Pt will demonstrate 15 degree increase  in cervical extension ROM and B cervical rotation ROM that is pain free for improved functional mobility and QOL.    Baseline 3/22: ext 40 deg and painful, rotation R 60 deg and painful, rotation L 40 deg and painful, 4/24: see flowsheet    Time 12    Period Weeks    Status Not Met    Target Date 01/10/22      PT LONG TERM GOAL #4   Title Patient will reduce Neck Disability Index score to <10% to demonstrate minimal disability with ADL's including improved sleeping tolerance, sitting tolerance, etc for better mobility at home and work.    Baseline 3/22: to be completed future session    Time 12    Period Weeks    Status New    Target Date 01/10/22                   Plan - 01/01/22 0752     Clinical Impression Statement Patient's recent increase in no-shows and arrivals at incorrect times discussed. Patient reports increased confusion and depression with medication, educated on need to call physician, patient agreeable to this plan. Multiple large trigger points released in bilateral upper traps this session. Patient will continue to benefit from skilled physical therapy to improve strength, mobility, and decrease pain with ADLs    Personal Factors and Comorbidities Time since onset of injury/illness/exacerbation;Sex;Profession;Comorbidity 1    Comorbidities chronic back pain    Examination-Activity Limitations Bend;Locomotion Level;Hygiene/Grooming;Stairs;Stand;Sit;Sleep;Squat;Lift    Examination-Participation Restrictions Yard Work;Shop;Occupation;Community Activity;Cleaning;Meal Prep;Laundry    Stability/Clinical Decision Making Evolving/Moderate complexity    Rehab Potential Good    PT Frequency 2x / week    PT Duration 12 weeks    PT Treatment/Interventions ADLs/Self Care Home Management;Canalith Repostioning;Cryotherapy;Electrical Stimulation;Iontophoresis 28m/ml Dexamethasone;Moist Heat;Traction;Ultrasound;DME Instruction;Gait training;Stair training;Functional mobility  training;Therapeutic activities;Therapeutic exercise;Balance training;Neuromuscular re-education;Patient/family education;Orthotic Fit/Training;Manual techniques;Passive range of motion;Dry needling;Energy conservation;Splinting;Taping;Vestibular;Visual/perceptual remediation/compensation;Joint Manipulations    PT Next Visit Plan complete further assessment of cervical and thoracolumbar  spine, NDI, provide HEP    PT Home Exercise Plan to be issued next 1-2 visits    Consulted and Agree with Plan of Care Patient;Other (Comment)   interpreter            Patient will benefit from skilled therapeutic intervention in order to improve the following deficits and impairments:  Pain, Improper body mechanics, Impaired UE functional use, Impaired sensation, Hypomobility, Dizziness, Decreased strength, Decreased range of motion, Decreased activity tolerance, Postural dysfunction, Impaired flexibility, Difficulty walking, Increased muscle spasms, Decreased mobility, Decreased balance  Visit Diagnosis: Cervicalgia  Chronic low back pain, unspecified back pain laterality, unspecified whether sciatica present  Muscle weakness (generalized)     Problem List There are no problems to display for this patient.   Janna Arch, PT, DPT  01/01/2022, 8:19 AM  [place] MAIN [facility] SERVICES [address] [place], [place], [address] Phone: [phone]   Fax:  [phone]  Name: Sabrina Wolfe MRN: [id number] Date of Birth: 1980/09/15

## 2022-01-03 ENCOUNTER — Ambulatory Visit: Payer: Medicaid Other

## 2022-01-08 ENCOUNTER — Ambulatory Visit: Payer: Medicaid Other

## 2022-01-08 DIAGNOSIS — M542 Cervicalgia: Secondary | ICD-10-CM

## 2022-01-08 DIAGNOSIS — G8929 Other chronic pain: Secondary | ICD-10-CM

## 2022-01-08 DIAGNOSIS — M6281 Muscle weakness (generalized): Secondary | ICD-10-CM

## 2022-01-08 NOTE — Therapy (Signed)
[place] MAIN [facility] SERVICES [address] [place], [place], [address] Phone: [phone]   Fax:  [phone]  Physical Therapy Treatment/RECERT  Patient Details  Name: Sabrina Wolfe MRN: [id number] Date of Birth: 1981/05/28 Referring Provider (PT): Heron Sabins, [place]   Encounter Date: 01/08/2022   PT End of Session - 01/08/22 0827     Visit Number 16    Number of Visits 28    Date for PT Re-Evaluation 04/02/22    Authorization Type Medicaid [employer]-    PT Start Time [phone]    PT Stop Time 0845    PT Time Calculation (min) 35 min    Activity Tolerance Patient tolerated treatment well;No increased pain    Behavior During Therapy [facility] for tasks assessed/performed             History reviewed. No pertinent past medical history.  Past Surgical History:  Procedure Laterality Date   CHOLECYSTECTOMY     20 years ago    There were no vitals filed for this visit.   Subjective Assessment - 01/08/22 0816     Subjective Patient arrived late to PT session limiting session duration.    Pertinent History Pt presents to PT evaluation with interpreter (pt primarily speaks [place]) Pt in [place] 12/25/2020. Previously seen at another PT clinic to address LBP (back pain did not resolve). Pt reports immediate LBP and neck pain following accident that has remained. Pt with difficulty keeping her neck in a neutral position or with bending her neck to look down due to pain. She feels her neck fatigues quicky. She feels this pain mostly on her R side. Pt has N/T in B hands and BLEs. She endorses headaches that "wrap around" from occipital region to her L eye. Pt does feel dizzy at times when going from a bent over to standing position. She describes this as spinning. She reports she has noticed changes in her memory since the accident and that she "forgets things easily." She reports her balance has worsened. Pt has not yet seen a  neurologist. Pt reports her pain has significantly affected her ability to be active and perform her job duties. Pt formerly very active, enjoyed exercising. Her pain is affecting her sleep. She can no longer lie down on her L side without R side neck pain. She describes difficulties with turning and that her R low back "feels like it wants to come apart."    How long can you sit comfortably? Pt reports she is not comfortable still, she has to constantly change positions    How long can you stand comfortably? cannot stand still comfortably    How long can you walk comfortably? 4-5 hours    Diagnostic tests per chart "Cervical x-rays revealing mild multilevel spondylolysis and patient with symptoms of right cervical radiculitis.'; 08/08/21 DG cervical spine "IMPRESSION:  Mild degenerative change C5-C6, C6-C7, C7-T1. Small corticated bony  density noted along the anterior inferior aspect of the lower  endplate of C6. This may be related to degenerative change and or  old injury. No evidence of acute fracture or dislocation." MR LUMBAR SPINE 05/20/21 per chart: "IMPRESSION:  1. No spinal canal stenosis or neural foraminal narrowing.  2. Innumerable tiny cysts in the liver. These could represent  biliary hamartomas but are incompletely evaluated. An ultrasound of  the liver is recommended."    Patient Stated Goals Pt would like to improve her pain. Pt would like to not have  to take medicine for pain relief. Pt would like to be able to perform her job duties without pain.    Currently in Pain? Yes    Pain Score 3     Pain Location Neck    Pain Orientation Right;Left    Pain Descriptors / Indicators Aching    Pain Type Chronic pain    Pain Onset More than a month ago    Pain Frequency Intermittent                    Goals: Hep: patient reports compliance  VAS: 5-6/10  FOTO: 64%  BUE strength:   Right Left  Upper trap 4 4  Biceps 4+ 4+  Triceps 4+ 4+  Shoulder abduction 4 4  Shoulder  flexion 4 4  Shoulder extension  4 4    Cervical extension and rotation:     Right Left  Flexion 41  Extension 46  Side Bending 30 40  Rotation 52 61    NDI: 52%   Pt educated throughout session about proper posture and technique with exercises. Improved exercise technique, movement at target joints, use of target muscles after min to mod verbal, visual, tactile cues. Rationale for Evaluation and Treatment Rehabilitation  Treatment:    Access Code: [email or web address] URL: [email or web address] Date: 01/08/2022 Prepared by: Janna Arch  Exercises - Seated Scapular Retraction  - 1 x daily - 7 x weekly - 2 sets - 10 reps - 5 hold - Supine Cervical Rotation AROM on Pillow  - 1 x daily - 7 x weekly - 2 sets - 10 reps - 5 hold - Supine Chin Tuck  - 1 x daily - 7 x weekly - 2 sets - 10 reps - 5 hold - Mid-Lower Cervical Extension SNAG with Strap  - 1 x daily - 7 x weekly - 2 sets - 10 reps - 5 hold - Seated Gentle Upper Trapezius Stretch  - 1 x daily - 7 x weekly - 2 sets - 2 reps - 30 hold  Trigger Point Dry Needling (TDN), unbilled Education performed with patient regarding potential benefit of TDN. Reviewed precautions and risks with patient. Reviewed special precautions/risks over lung fields which include pneumothorax. Reviewed signs and symptoms of pneumothorax and advised pt to go to ER immediately if these symptoms develop advise them of dry needling treatment. Extensive time spent with pt to ensure full understanding of TDN risks. Pt provided verbal consent to treatment. TDN performed to  with 0.25 x 40 single needle placements with local twitch response (LTR). Pistoning technique utilized. Improved pain-free motion following intervention. Bilateral upper trap x 2 minutes     Patient arrived late to session limiting session duration. Patient does continue to have pain and limitations at work and at home. Re-education on posture education as well as progression of HEP  performed. She does have less pain than starting therapy as well as having increased ROM but is not yet to functional level. Patient additionally educated on need for arriving on time to appointments as well as showing up for appointments to achieve full benefit of sessions. Patient will continue to benefit from skilled physical therapy to improve strength, mobility, and decrease pain with ADLs              PT Education - 01/08/22 0817     Education Details goals, plan of care    Person(s) Educated Patient    Methods Explanation    Comprehension Verbalized understanding  PT Short Term Goals - 01/08/22 7121       PT SHORT TERM GOAL #1   Title Patient will be independent in home exercise program to improve strength/mobility for better functional independence with ADLs.    Baseline 3/22: to be issued next 1-2 visits 4/24: independent and adherent; 6/12: compliant    Time 6    Period Weeks    Status Achieved    Target Date 11/29/21      PT SHORT TERM GOAL #2   Title Patient will report a worst pain of 3/10 to improve tolerance with ADLs and reduced symptoms with work activities.    Baseline 3/22: worst pain 8/10, 4/24: worst pain: 7-8/10 in neck 6/12: 5-6/10    Time 6    Period Weeks    Status Partially Met    Target Date 02/19/22               PT Long Term Goals - 01/08/22 0825       PT LONG TERM GOAL #1   Title Patient will increase FOTO score to equal to or greater than 71  to demonstrate statistically significant improvement in mobility and quality of life.    Baseline 3/22: 60, 4/24: 60% 6/12: 64%    Time 12    Period Weeks    Status Partially Met    Target Date 04/02/22      PT LONG TERM GOAL #2   Title Patient will increase BUE gross strength and gross strength of cervical musculature to at least a 4+/5 to improve ADL ability and QOL.    Baseline 3/22: UE strength grossly 4/5 B, cervical musculature grossly  4-/5, 4/24: see flowsheet  6/12: see flowsheet    Time 12    Period Weeks    Status Partially Met    Target Date 04/02/22      PT LONG TERM GOAL #3   Title Pt will demonstrate 15 degree increase in cervical extension ROM and B cervical rotation ROM that is pain free for improved functional mobility and QOL.    Baseline 3/22: ext 40 deg and painful, rotation R 60 deg and painful, rotation L 40 deg and painful, 4/24: see flowsheet 6/12: flexion 41, extension 46, side bend 30 R 40 L, rotation 52 right, 61L.    Time 12    Period Weeks    Status Partially Met    Target Date 04/02/22      PT LONG TERM GOAL #4   Title Patient will reduce Neck Disability Index score to <10% to demonstrate minimal disability with ADL's including improved sleeping tolerance, sitting tolerance, etc for better mobility at home and work.    Baseline 3/22: to be completed future session 6/12: 52%    Time 12    Period Weeks    Status Partially Met    Target Date 04/02/22                   Plan - 01/08/22 0949     Clinical Impression Statement Patient arrived late to session limiting session duration. Patient does continue to have pain and limitations at work and at home. Re-education on posture education as well as progression of HEP performed. She does have less pain than starting therapy as well as having increased ROM but is not yet to functional level. Patient additionally educated on need for arriving on time to appointments as well as showing up for appointments to achieve full benefit of sessions. Patient  will continue to benefit from skilled physical therapy to improve strength, mobility, and decrease pain with ADLs    Personal Factors and Comorbidities Time since onset of injury/illness/exacerbation;Sex;Profession;Comorbidity 1    Comorbidities chronic back pain    Examination-Activity Limitations Bend;Locomotion Level;Hygiene/Grooming;Stairs;Stand;Sit;Sleep;Squat;Lift    Examination-Participation Restrictions Yard  Work;Shop;Occupation;Community Activity;Cleaning;Meal Prep;Laundry    Stability/Clinical Decision Making Evolving/Moderate complexity    Rehab Potential Good    PT Frequency 1x / week    PT Duration 12 weeks    PT Treatment/Interventions ADLs/Self Care Home Management;Canalith Repostioning;Cryotherapy;Electrical Stimulation;Iontophoresis 7m/ml Dexamethasone;Moist Heat;Traction;Ultrasound;DME Instruction;Gait training;Stair training;Functional mobility training;Therapeutic activities;Therapeutic exercise;Balance training;Neuromuscular re-education;Patient/family education;Orthotic Fit/Training;Manual techniques;Passive range of motion;Dry needling;Energy conservation;Splinting;Taping;Vestibular;Visual/perceptual remediation/compensation;Joint Manipulations    PT Next Visit Plan complete further assessment of cervical and thoracolumbar spine, NDI, provide HEP    PT Home Exercise Plan to be issued next 1-2 visits    Consulted and Agree with Plan of Care Patient;Other (Comment)   interpreter            Patient will benefit from skilled therapeutic intervention in order to improve the following deficits and impairments:  Pain, Improper body mechanics, Impaired UE functional use, Impaired sensation, Hypomobility, Dizziness, Decreased strength, Decreased range of motion, Decreased activity tolerance, Postural dysfunction, Impaired flexibility, Difficulty walking, Increased muscle spasms, Decreased mobility, Decreased balance  Visit Diagnosis: Cervicalgia  Chronic low back pain, unspecified back pain laterality, unspecified whether sciatica present  Muscle weakness (generalized)     Problem List There are no problems to display for this patient.   MJanna Arch PT, DPT  01/08/2022, 9:50 AM  [place] [facility] [address] [address]Phone: [phone]  Fax:  [phone] Name: MSheresa CullopMRN:  [id number] of Birth: 514-Mar-1982

## 2022-01-10 ENCOUNTER — Ambulatory Visit: Payer: Medicaid Other

## 2022-01-15 ENCOUNTER — Ambulatory Visit: Payer: Medicaid Other

## 2022-01-15 DIAGNOSIS — M6281 Muscle weakness (generalized): Secondary | ICD-10-CM

## 2022-01-15 DIAGNOSIS — M542 Cervicalgia: Secondary | ICD-10-CM | POA: Diagnosis not present

## 2022-01-15 DIAGNOSIS — M545 Low back pain, unspecified: Secondary | ICD-10-CM

## 2022-01-15 NOTE — Therapy (Signed)
[place] MAIN [facility] SERVICES [address] [place], [place], [address] Phone: [phone]   Fax:  [phone]  Physical Therapy Treatment  Patient Details  Name: Sabrina Wolfe MRN: [id number] Date of Birth: 08-03-1980 Referring Provider (PT): Heron Sabins, [place]   Encounter Date: 01/15/2022   PT End of Session - 01/15/22 0842     Visit Number 17    Number of Visits 28    Date for PT Re-Evaluation 04/02/22    Authorization Type Medicaid [employer]-    PT Start Time [phone]    PT Stop Time 0845    PT Time Calculation (min) 35 min    Activity Tolerance Patient tolerated treatment well;No increased pain    Behavior During Therapy [facility] for tasks assessed/performed             No past medical history on file.  Past Surgical History:  Procedure Laterality Date   CHOLECYSTECTOMY     20 years ago    There were no vitals filed for this visit.   Subjective Assessment - 01/15/22 0830     Subjective Patient arrived late to PT session.  Has been having less pain washing dishes but is still present    Pertinent History Pt presents to PT evaluation with interpreter (pt primarily speaks [place]) Pt in [place] 12/25/2020. Previously seen at another PT clinic to address LBP (back pain did not resolve). Pt reports immediate LBP and neck pain following accident that has remained. Pt with difficulty keeping her neck in a neutral position or with bending her neck to look down due to pain. She feels her neck fatigues quicky. She feels this pain mostly on her R side. Pt has N/T in B hands and BLEs. She endorses headaches that "wrap around" from occipital region to her L eye. Pt does feel dizzy at times when going from a bent over to standing position. She describes this as spinning. She reports she has noticed changes in her memory since the accident and that she "forgets things easily." She reports her balance has worsened. Pt has not yet seen a  neurologist. Pt reports her pain has significantly affected her ability to be active and perform her job duties. Pt formerly very active, enjoyed exercising. Her pain is affecting her sleep. She can no longer lie down on her L side without R side neck pain. She describes difficulties with turning and that her R low back "feels like it wants to come apart."    How long can you sit comfortably? Pt reports she is not comfortable still, she has to constantly change positions    How long can you stand comfortably? cannot stand still comfortably    How long can you walk comfortably? 4-5 hours    Diagnostic tests per chart "Cervical x-rays revealing mild multilevel spondylolysis and patient with symptoms of right cervical radiculitis.'; 08/08/21 DG cervical spine "IMPRESSION:  Mild degenerative change C5-C6, C6-C7, C7-T1. Small corticated bony  density noted along the anterior inferior aspect of the lower  endplate of C6. This may be related to degenerative change and or  old injury. No evidence of acute fracture or dislocation." MR LUMBAR SPINE 05/20/21 per chart: "IMPRESSION:  1. No spinal canal stenosis or neural foraminal narrowing.  2. Innumerable tiny cysts in the liver. These could represent  biliary hamartomas but are incompletely evaluated. An ultrasound of  the liver is recommended."    Patient Stated Goals Pt would like to improve  her pain. Pt would like to not have to take medicine for pain relief. Pt would like to be able to perform her job duties without pain.    Currently in Pain? Yes    Pain Score 3     Pain Location Neck    Pain Descriptors / Indicators Aching    Pain Type Chronic pain    Pain Onset More than a month ago    Pain Frequency Intermittent                TREATMENT:     Manual Therapy:  Patient Prone:  PT performed grade II-III PA mobs at T1-T8 10 sec bouts x2 reps each level with moderate discomfort and guarding at T4-T7 Grade II mobilizations to cervical spine x 3  minutes Suboccipital release 30 seconds x2 trials    TherEx Scapular retraction with cervical rotation 10x 5 second holds each direction Chin tucks 10x5 second holds Seated upper trap stretch hold 30 seconds each side  Towel side bend with distraction seated 2x 30 seconds each direction  GTB overhead Y 10x ; 2 sets  Standing wall chin tucks with scapular retraction 5x 10 second holds  On half foam roller supine: -Robber stretch 30 seconds  -Pectoral stretch from arnold position to [employer] stretch 10   Trigger Point Dry Needling (TDN), unbilled Education performed with patient regarding potential benefit of TDN. Reviewed precautions and risks with patient. Reviewed special precautions/risks over lung fields which include pneumothorax. Reviewed signs and symptoms of pneumothorax and advised pt to go to ER immediately if these symptoms develop advise them of dry needling treatment. Extensive time spent with pt to ensure full understanding of TDN risks. Pt provided verbal consent to treatment. TDN performed to  with 0.3 x 30 single needle placements with local twitch response (LTR). Pistoning technique utilized. Improved pain-free motion following intervention. Muscles targeted L cervical paraspinals, L and R upper traps, x 6  minutes    Pt educated throughout session about proper posture and technique with exercises. Improved exercise technique, movement at target joints, use of target muscles after min to mod verbal, visual, tactile cues.     Patient continues to require education on arriving to PT session on time as she arrived 10 minutes late again. Patient will need to arrive on time to ensure full therapeutic benefits. Patient has significant upper trap releases bilaterally. Patient will continue to benefit from skilled physical therapy to improve strength, mobility, and decrease pain with ADLs                     PT Education - 01/15/22 0838     Education Details exercise  technique, body mechanics    Person(s) Educated Patient    Methods Demonstration;Explanation;Tactile cues;Verbal cues    Comprehension Verbalized understanding;Returned demonstration;Verbal cues required;Tactile cues required;Need further instruction              PT Short Term Goals - 01/08/22 8032       PT SHORT TERM GOAL #1   Title Patient will be independent in home exercise program to improve strength/mobility for better functional independence with ADLs.    Baseline 3/22: to be issued next 1-2 visits 4/24: independent and adherent; 6/12: compliant    Time 6    Period Weeks    Status Achieved    Target Date 11/29/21      PT SHORT TERM GOAL #2   Title Patient will report a worst pain of 3/10  to improve tolerance with ADLs and reduced symptoms with work activities.    Baseline 3/22: worst pain 8/10, 4/24: worst pain: 7-8/10 in neck 6/12: 5-6/10    Time 6    Period Weeks    Status Partially Met    Target Date 02/19/22               PT Long Term Goals - 01/08/22 0825       PT LONG TERM GOAL #1   Title Patient will increase FOTO score to equal to or greater than 71  to demonstrate statistically significant improvement in mobility and quality of life.    Baseline 3/22: 60, 4/24: 60% 6/12: 64%    Time 12    Period Weeks    Status Partially Met    Target Date 04/02/22      PT LONG TERM GOAL #2   Title Patient will increase BUE gross strength and gross strength of cervical musculature to at least a 4+/5 to improve ADL ability and QOL.    Baseline 3/22: UE strength grossly 4/5 B, cervical musculature grossly  4-/5, 4/24: see flowsheet 6/12: see flowsheet    Time 12    Period Weeks    Status Partially Met    Target Date 04/02/22      PT LONG TERM GOAL #3   Title Pt will demonstrate 15 degree increase in cervical extension ROM and B cervical rotation ROM that is pain free for improved functional mobility and QOL.    Baseline 3/22: ext 40 deg and painful, rotation R  60 deg and painful, rotation L 40 deg and painful, 4/24: see flowsheet 6/12: flexion 41, extension 46, side bend 30 R 40 L, rotation 52 right, 61L.    Time 12    Period Weeks    Status Partially Met    Target Date 04/02/22      PT LONG TERM GOAL #4   Title Patient will reduce Neck Disability Index score to <10% to demonstrate minimal disability with ADL's including improved sleeping tolerance, sitting tolerance, etc for better mobility at home and work.    Baseline 3/22: to be completed future session 6/12: 52%    Time 12    Period Weeks    Status Partially Met    Target Date 04/02/22                   Plan - 01/15/22 1256     Clinical Impression Statement Patient continues to require education on arriving to PT session on time as she arrived 10 minutes late again. Patient will need to arrive on time to ensure full therapeutic benefits. Patient has significant upper trap releases bilaterally. Patient will continue to benefit from skilled physical therapy to improve strength, mobility, and decrease pain with ADLs    Personal Factors and Comorbidities Time since onset of injury/illness/exacerbation;Sex;Profession;Comorbidity 1    Comorbidities chronic back pain    Examination-Activity Limitations Bend;Locomotion Level;Hygiene/Grooming;Stairs;Stand;Sit;Sleep;Squat;Lift    Examination-Participation Restrictions Yard Work;Shop;Occupation;Community Activity;Cleaning;Meal Prep;Laundry    Stability/Clinical Decision Making Evolving/Moderate complexity    Rehab Potential Good    PT Frequency 1x / week    PT Duration 12 weeks    PT Treatment/Interventions ADLs/Self Care Home Management;Canalith Repostioning;Cryotherapy;Electrical Stimulation;Iontophoresis 54m/ml Dexamethasone;Moist Heat;Traction;Ultrasound;DME Instruction;Gait training;Stair training;Functional mobility training;Therapeutic activities;Therapeutic exercise;Balance training;Neuromuscular re-education;Patient/family  education;Orthotic Fit/Training;Manual techniques;Passive range of motion;Dry needling;Energy conservation;Splinting;Taping;Vestibular;Visual/perceptual remediation/compensation;Joint Manipulations    PT Next Visit Plan complete further assessment of cervical and thoracolumbar spine, NDI, provide HEP  PT Home Exercise Plan to be issued next 1-2 visits    Consulted and Agree with Plan of Care Patient;Other (Comment)   interpreter            Patient will benefit from skilled therapeutic intervention in order to improve the following deficits and impairments:  Pain, Improper body mechanics, Impaired UE functional use, Impaired sensation, Hypomobility, Dizziness, Decreased strength, Decreased range of motion, Decreased activity tolerance, Postural dysfunction, Impaired flexibility, Difficulty walking, Increased muscle spasms, Decreased mobility, Decreased balance  Visit Diagnosis: Cervicalgia  Chronic low back pain, unspecified back pain laterality, unspecified whether sciatica present  Muscle weakness (generalized)     Problem List There are no problems to display for this patient.   Janna Arch, PT, DPT  01/15/2022, 12:57 PM  [place] MAIN [facility] SERVICES [address] [place], [place], [address] Phone: [phone]   Fax:  [phone]  Name: Sabrina Wolfe MRN: [id number] Date of Birth: 1981-07-10

## 2022-01-17 ENCOUNTER — Ambulatory Visit: Payer: Medicaid Other

## 2022-01-18 NOTE — Therapy (Incomplete)
OUTPATIENT PHYSICAL THERAPY TREATMENT NOTE   Patient Name: Sabrina Wolfe MRN: [id number] DOB:10/16/1980, 41 y.o., female Today's Date: 01/18/2022  PCP: Remus Blake , MD  REFERRING PROVIDER: Hardie Lora PA    No past medical history on file. Past Surgical History:  Procedure Laterality Date   CHOLECYSTECTOMY     20 years ago   There are no problems to display for this patient.   REFERRING DIAG: radiculopathy, spondylosis without myelopathy   THERAPY DIAG:  No diagnosis found.  Rationale for Evaluation and Treatment Rehabilitation  PERTINENT HISTORY: Pt presents to PT evaluation with interpreter (pt primarily speaks Spanish) Pt in [place] 12/25/2020. Previously seen at another PT clinic to address LBP (back pain did not resolve). Pt reports immediate LBP and neck pain following accident that has remained. Pt with difficulty keeping her neck in a neutral position or with bending her neck to look down due to pain. She feels her neck fatigues quicky. She feels this pain mostly on her R side. Pt has N/T in B hands and BLEs. She endorses headaches that "wrap around" from occipital region to her L eye. Pt does feel dizzy at times when going from a bent over to standing position. She describes this as spinning. She reports she has noticed changes in her memory since the accident and that she "forgets things easily." She reports her balance has worsened. Pt has not yet seen a neurologist. Pt reports her pain has significantly affected her ability to be active and perform her job duties. Pt formerly very active, enjoyed exercising. Her pain is affecting her sleep. She can no longer lie down on her L side without R side neck pain. She describes difficulties with turning and that her R low back "feels like it wants to come apart."   PRECAUTIONS: n/a  SUBJECTIVE: ***  PAIN:  Are you having pain? {[id number]}     TODAY'S TREATMENT:    TREATMENT:     Manual  Therapy:  Patient Prone:  PT performed grade II-III PA mobs at T1-T8 10 sec bouts x2 reps each level with moderate discomfort and guarding at T4-T7 Grade II mobilizations to cervical spine x 3 minutes Suboccipital release 30 seconds x2 trials    TherEx Scapular retraction with cervical rotation 10x 5 second holds each direction Chin tucks 10x5 second holds Seated upper trap stretch hold 30 seconds each side  Towel side bend with distraction seated 2x 30 seconds each direction  GTB overhead Y 10x ; 2 sets  Standing wall chin tucks with scapular retraction 5x 10 second holds  On half foam roller supine: -Robber stretch 30 seconds  -Pectoral stretch from arnold position to [employer] stretch 10   Trigger Point Dry Needling (TDN), unbilled Education performed with patient regarding potential benefit of TDN. Reviewed precautions and risks with patient. Reviewed special precautions/risks over lung fields which include pneumothorax. Reviewed signs and symptoms of pneumothorax and advised pt to go to ER immediately if these symptoms develop advise them of dry needling treatment. Extensive time spent with pt to ensure full understanding of TDN risks. Pt provided verbal consent to treatment. TDN performed to  with 0.3 x 30 single needle placements with local twitch response (LTR). Pistoning technique utilized. Improved pain-free motion following intervention. Muscles targeted L cervical paraspinals, L and R upper traps, x 6  minutes    Pt educated throughout session about proper posture and technique with exercises. Improved exercise technique, movement at target joints, use of target muscles  after min to mod verbal, visual, tactile cues.     PATIENT EDUCATION: Education details: exercise technique, pain reduction, TDN Person educated: Patient Education method: Explanation, Demonstration, Tactile cues, and Verbal cues Education comprehension: verbalized understanding, returned demonstration, verbal  cues required, and tactile cues required   HOME EXERCISE PROGRAM: Continue with current POC.    PT Short Term Goals      PT SHORT TERM GOAL #1   Title Patient will be independent in home exercise program to improve strength/mobility for better functional independence with ADLs.    Baseline 3/22: to be issued next 1-2 visits 4/24: independent and adherent; 6/12: compliant    Time 6    Period Weeks    Status Achieved    Target Date 11/29/21      PT SHORT TERM GOAL #2   Title Patient will report a worst pain of 3/10 to improve tolerance with ADLs and reduced symptoms with work activities.    Baseline 3/22: worst pain 8/10, 4/24: worst pain: 7-8/10 in neck 6/12: 5-6/10    Time 6    Period Weeks    Status Partially Met    Target Date 02/19/22              PT Long Term Goals      PT LONG TERM GOAL #1   Title Patient will increase FOTO score to equal to or greater than 71  to demonstrate statistically significant improvement in mobility and quality of life.    Baseline 3/22: 60, 4/24: 60% 6/12: 64%    Time 12    Period Weeks    Status Partially Met    Target Date 04/02/22      PT LONG TERM GOAL #2   Title Patient will increase BUE gross strength and gross strength of cervical musculature to at least a 4+/5 to improve ADL ability and QOL.    Baseline 3/22: UE strength grossly 4/5 B, cervical musculature grossly  4-/5, 4/24: see flowsheet 6/12: see flowsheet    Time 12    Period Weeks    Status Partially Met    Target Date 04/02/22      PT LONG TERM GOAL #3   Title Pt will demonstrate 15 degree increase in cervical extension ROM and B cervical rotation ROM that is pain free for improved functional mobility and QOL.    Baseline 3/22: ext 40 deg and painful, rotation R 60 deg and painful, rotation L 40 deg and painful, 4/24: see flowsheet 6/12: flexion 41, extension 46, side bend 30 R 40 L, rotation 52 right, 61L.    Time 12    Period Weeks    Status Partially Met     Target Date 04/02/22      PT LONG TERM GOAL #4   Title Patient will reduce Neck Disability Index score to <10% to demonstrate minimal disability with ADL's including improved sleeping tolerance, sitting tolerance, etc for better mobility at home and work.    Baseline 3/22: to be completed future session 6/12: 52%    Time 12    Period Weeks    Status Partially Met    Target Date 04/02/22                 Janna Arch, PT 01/18/2022, 1:24 PM

## 2022-01-22 ENCOUNTER — Ambulatory Visit: Payer: Medicaid Other

## 2022-01-22 DIAGNOSIS — M6281 Muscle weakness (generalized): Secondary | ICD-10-CM

## 2022-01-22 DIAGNOSIS — M542 Cervicalgia: Secondary | ICD-10-CM | POA: Diagnosis not present

## 2022-01-22 DIAGNOSIS — G8929 Other chronic pain: Secondary | ICD-10-CM

## 2022-01-31 NOTE — Therapy (Signed)
OUTPATIENT PHYSICAL THERAPY TREATMENT NOTE   Patient Name: Sabrina Wolfe MRN: [id number] DOB:[birth year], 41 y.o., female Today's Date: 02/01/2022  PCP: Remus Blake , MD  REFERRING PROVIDER: Hardie Lora PA   PT End of Session - 02/01/22 0836     Visit Number 19    Number of Visits 28    Date for PT Re-Evaluation 04/02/22    Authorization Type Medicaid [employer]-    Authorization Time Period 1/6 sessions left    PT Start Time 0813    PT Stop Time 0844    PT Time Calculation (min) 31 min    Activity Tolerance Patient tolerated treatment well;No increased pain    Behavior During Therapy [facility] for tasks assessed/performed              History reviewed. No pertinent past medical history. Past Surgical History:  Procedure Laterality Date   CHOLECYSTECTOMY     20 years ago   There are no problems to display for this patient.   REFERRING DIAG: radiculopathy, spondylosis without myelopathy   THERAPY DIAG:  Cervicalgia  Muscle weakness (generalized)  Chronic low back pain, unspecified back pain laterality, unspecified whether sciatica present  Rationale for Evaluation and Treatment Rehabilitation  PERTINENT HISTORY: Pt presents to PT evaluation with interpreter (pt primarily speaks Spanish) Pt in [place] 12/25/2020. Previously seen at another PT clinic to address LBP (back pain did not resolve). Pt reports immediate LBP and neck pain following accident that has remained. Pt with difficulty keeping her neck in a neutral position or with bending her neck to look down due to pain. She feels her neck fatigues quicky. She feels this pain mostly on her R side. Pt has N/T in B hands and BLEs. She endorses headaches that "wrap around" from occipital region to her L eye. Pt does feel dizzy at times when going from a bent over to standing position. She describes this as spinning. She reports she has noticed changes in her memory since the accident and that she  "forgets things easily." She reports her balance has worsened. Pt has not yet seen a neurologist. Pt reports her pain has significantly affected her ability to be active and perform her job duties. Pt formerly very active, enjoyed exercising. Her pain is affecting her sleep. She can no longer lie down on her L side without R side neck pain. She describes difficulties with turning and that her R low back "feels like it wants to come apart."   PRECAUTIONS: n/a  SUBJECTIVE: Patient reports whenever she tries to do heavier work her symptoms come back   PAIN:  Are you having pain? Yes: NPRS scale: 2/10 Pain location: cervical region Pain description: aching Aggravating factors: work Relieving factors: rest     TODAY'S TREATMENT:    TREATMENT:     Manual Therapy:  Patient Prone:  PT performed grade II-III PA mobs at T1-T8 10 sec bouts x2 reps each level with moderate discomfort and guarding at T4-T7 Grade II mobilizations to cervical spine x 3 minutes Suboccipital release 30 seconds x2 trials    TherEx Chin tuck 3 seconds 10x On half foam roller: -robber stretch 30 seconds -arnold stretch 10x -RTB overhead raise   Trigger Point Dry Needling (TDN), unbilled Education performed with patient regarding potential benefit of TDN. Reviewed precautions and risks with patient. Reviewed special precautions/risks over lung fields which include pneumothorax. Reviewed signs and symptoms of pneumothorax and advised pt to go to ER immediately if these  symptoms develop advise them of dry needling treatment. Extensive time spent with pt to ensure full understanding of TDN risks. Pt provided verbal consent to treatment. TDN performed to  with 0.3 x 30 single needle placements with local twitch response (LTR). Pistoning technique utilized. Improved pain-free motion following intervention. Muscles targeted L cervical paraspinals, L and R upper traps, x 5  minutes    Pt educated throughout session about  proper posture and technique with exercises. Improved exercise technique, movement at target joints, use of target muscles after min to mod verbal, visual, tactile cues.     PATIENT EDUCATION: Education details: exercise technique, pain reduction, TDN Person educated: Patient Education method: Explanation, Demonstration, Tactile cues, and Verbal cues Education comprehension: verbalized understanding, returned demonstration, verbal cues required, and tactile cues required   HOME EXERCISE PROGRAM: Continue with current POC.    PT Short Term Goals      PT SHORT TERM GOAL #1   Title Patient will be independent in home exercise program to improve strength/mobility for better functional independence with ADLs.    Baseline 3/22: to be issued next 1-2 visits 4/24: independent and adherent; 6/12: compliant    Time 6    Period Weeks    Status Achieved    Target Date 11/29/21      PT SHORT TERM GOAL #2   Title Patient will report a worst pain of 3/10 to improve tolerance with ADLs and reduced symptoms with work activities.    Baseline 3/22: worst pain 8/10, 4/24: worst pain: 7-8/10 in neck 6/12: 5-6/10    Time 6    Period Weeks    Status Partially Met    Target Date 02/19/22              PT Long Term Goals      PT LONG TERM GOAL #1   Title Patient will increase FOTO score to equal to or greater than 71  to demonstrate statistically significant improvement in mobility and quality of life.    Baseline 3/22: 60, 4/24: 60% 6/12: 64%    Time 12    Period Weeks    Status Partially Met    Target Date 04/02/22      PT LONG TERM GOAL #2   Title Patient will increase BUE gross strength and gross strength of cervical musculature to at least a 4+/5 to improve ADL ability and QOL.    Baseline 3/22: UE strength grossly 4/5 B, cervical musculature grossly  4-/5, 4/24: see flowsheet 6/12: see flowsheet    Time 12    Period Weeks    Status Partially Met    Target Date 04/02/22      PT  LONG TERM GOAL #3   Title Pt will demonstrate 15 degree increase in cervical extension ROM and B cervical rotation ROM that is pain free for improved functional mobility and QOL.    Baseline 3/22: ext 40 deg and painful, rotation R 60 deg and painful, rotation L 40 deg and painful, 4/24: see flowsheet 6/12: flexion 41, extension 46, side bend 30 R 40 L, rotation 52 right, 61L.    Time 12    Period Weeks    Status Partially Met    Target Date 04/02/22      PT LONG TERM GOAL #4   Title Patient will reduce Neck Disability Index score to <10% to demonstrate minimal disability with ADL's including improved sleeping tolerance, sitting tolerance, etc for better mobility at home and work.  Baseline 3/22: to be completed future session 6/12: 52%    Time 12    Period Weeks    Status Partially Met    Target Date 04/02/22               Plan     Clinical Impression Statement Patient educated that she will have 5 visits left after this session. Verbalized understanding. Patient very late for PT session, re-educated on need for attendance and timeliness for full return to function.  Patient will continue to benefit from skilled physical therapy to improve strength, mobility, and decrease pain with ADLs    Personal Factors and Comorbidities Time since onset of injury/illness/exacerbation;Sex;Profession;Comorbidity 1    Comorbidities chronic back pain    Examination-Activity Limitations Bend;Locomotion Level;Hygiene/Grooming;Stairs;Stand;Sit;Sleep;Squat;Lift    Examination-Participation Restrictions Yard Work;Shop;Occupation;Community Activity;Cleaning;Meal Prep;Laundry    Stability/Clinical Decision Making Evolving/Moderate complexity    Rehab Potential Good    PT Frequency 1x / week    PT Duration 12 weeks    PT Treatment/Interventions ADLs/Self Care Home Management;Canalith Repostioning;Cryotherapy;Electrical Stimulation;Iontophoresis 42m/ml Dexamethasone;Moist Heat;Traction;Ultrasound;DME  Instruction;Gait training;Stair training;Functional mobility training;Therapeutic activities;Therapeutic exercise;Balance training;Neuromuscular re-education;Patient/family education;Orthotic Fit/Training;Manual techniques;Passive range of motion;Dry needling;Energy conservation;Splinting;Taping;Vestibular;Visual/perceptual remediation/compensation;Joint Manipulations    PT Next Visit Plan TDN, postural strengthening    PT Home Exercise Plan to be issued next 1-2 visits    Consulted and Agree with Plan of Care Patient;Other (Comment)   interpreter             MJanna Arch PT, DPT  02/01/2022, 8:45 AM

## 2022-02-01 ENCOUNTER — Ambulatory Visit: Payer: Medicaid Other | Attending: Orthopedic Surgery

## 2022-02-01 DIAGNOSIS — M6281 Muscle weakness (generalized): Secondary | ICD-10-CM | POA: Diagnosis present

## 2022-02-01 DIAGNOSIS — M545 Low back pain, unspecified: Secondary | ICD-10-CM | POA: Diagnosis present

## 2022-02-01 DIAGNOSIS — G8929 Other chronic pain: Secondary | ICD-10-CM | POA: Insufficient documentation

## 2022-02-01 DIAGNOSIS — M5441 Lumbago with sciatica, right side: Secondary | ICD-10-CM | POA: Insufficient documentation

## 2022-02-01 DIAGNOSIS — M542 Cervicalgia: Secondary | ICD-10-CM | POA: Insufficient documentation

## 2022-02-01 NOTE — Therapy (Signed)
OUTPATIENT PHYSICAL THERAPY TREATMENT NOTE/ Physical Therapy Progress Note   Dates of reporting period  11/20/21   to   02/05/22     Patient Name: Sabrina Wolfe MRN: [id number] DOB:1981/03/28, 41 y.o., female Today's Date: 02/05/2022  PCP: Remus Blake , MD  REFERRING PROVIDER: Hardie Lora PA   PT End of Session - 02/05/22 0815     Visit Number 20    Number of Visits 28    Date for PT Re-Evaluation 04/02/22    Authorization Type Medicaid [employer]-    Authorization Time Period 2/6 sessions left    PT Start Time 0804    PT Stop Time 0845    PT Time Calculation (min) 41 min    Activity Tolerance Patient tolerated treatment well;No increased pain    Behavior During Therapy [facility] for tasks assessed/performed               History reviewed. No pertinent past medical history. Past Surgical History:  Procedure Laterality Date   CHOLECYSTECTOMY     20 years ago   There are no problems to display for this patient.   REFERRING DIAG: radiculopathy, spondylosis without myelopathy   THERAPY DIAG:  Cervicalgia  Muscle weakness (generalized)  Chronic low back pain, unspecified back pain laterality, unspecified whether sciatica present  Rationale for Evaluation and Treatment Rehabilitation  PERTINENT HISTORY: Pt presents to PT evaluation with interpreter (pt primarily speaks Spanish) Pt in [place] 12/25/2020. Previously seen at another PT clinic to address LBP (back pain did not resolve). Pt reports immediate LBP and neck pain following accident that has remained. Pt with difficulty keeping her neck in a neutral position or with bending her neck to look down due to pain. She feels her neck fatigues quicky. She feels this pain mostly on her R side. Pt has N/T in B hands and BLEs. She endorses headaches that "wrap around" from occipital region to her L eye. Pt does feel dizzy at times when going from a bent over to standing position. She describes this as  spinning. She reports she has noticed changes in her memory since the accident and that she "forgets things easily." She reports her balance has worsened. Pt has not yet seen a neurologist. Pt reports her pain has significantly affected her ability to be active and perform her job duties. Pt formerly very active, enjoyed exercising. Her pain is affecting her sleep. She can no longer lie down on her L side without R side neck pain. She describes difficulties with turning and that her R low back "feels like it wants to come apart."   PRECAUTIONS: n/a  SUBJECTIVE: Patient mixed up her times and came at wrong time. Was able to be seen despite her coming at wrong time   PAIN:  Are you having pain? Yes: NPRS scale: 2/10 Pain location: cervical region Pain description: aching Aggravating factors: work Relieving factors: rest     TODAY'S TREATMENT:    TREATMENT:    goals:  FOTO: 53% BUE strength: grossly 4+/5  Cervical AROM  Right Left  Flexion 55  Extension 30* pain  Side Bending 33 32  Rotation 59 52    NDI: 30%      TherEx HEP education and performance: see below   Trigger Point Dry Needling (TDN), unbilled Education performed with patient regarding potential benefit of TDN. Reviewed precautions and risks with patient. Reviewed special precautions/risks over lung fields which include pneumothorax. Reviewed signs and symptoms of pneumothorax and  advised pt to go to ER immediately if these symptoms develop advise them of dry needling treatment. Extensive time spent with pt to ensure full understanding of TDN risks. Pt provided verbal consent to treatment. TDN performed to  with 0.3 x 30 single needle placements with local twitch response (LTR). Pistoning technique utilized. Improved pain-free motion following intervention. Muscles targeted L cervical paraspinals, L and R upper traps, x 5  minutes    Pt educated throughout session about proper posture and technique with exercises.  Improved exercise technique, movement at target joints, use of target muscles after min to mod verbal, visual, tactile cues.     PATIENT EDUCATION: Education details: exercise technique, pain reduction, TDN Person educated: Patient Education method: Explanation, Demonstration, Tactile cues, and Verbal cues Education comprehension: verbalized understanding, returned demonstration, verbal cues required, and tactile cues required   HOME EXERCISE PROGRAM: Access Code: [place] URL: [email or web address] Date: 02/05/2022 Prepared by: Janna Arch  Exercises - Seated Scapular Retraction  - 1 x daily - 7 x weekly - 2 sets - 10 reps - 5 hold - Seated Upper Trapezius Stretch  - 1 x daily - 7 x weekly - 2 sets - 2 reps - 30 hold - Standing 'L' Stretch at Counter  - 1 x daily - 7 x weekly - 2 sets - 10 reps - 5 hold - Cervical Extension AROM with Strap  - 1 x daily - 7 x weekly - 2 sets - 10 reps - 5 hold   PT Short Term Goals      PT SHORT TERM GOAL #1   Title Patient will be independent in home exercise program to improve strength/mobility for better functional independence with ADLs.    Baseline 3/22: to be issued next 1-2 visits 4/24: independent and adherent; 6/12: compliant    Time 6    Period Weeks    Status Achieved    Target Date 11/29/21      PT SHORT TERM GOAL #2   Title Patient will report a worst pain of 3/10 to improve tolerance with ADLs and reduced symptoms with work activities.    Baseline 3/22: worst pain 8/10, 4/24: worst pain: 7-8/10 in neck 6/12: 5-6/10 7/10: 7/10    Time 6    Period Weeks    Status Partially Met    Target Date 02/19/22              PT Long Term Goals      PT LONG TERM GOAL #1   Title Patient will increase FOTO score to equal to or greater than 71  to demonstrate statistically significant improvement in mobility and quality of life.    Baseline 3/22: 60, 4/24: 60% 6/12: 64% 7/10: 53%    Time 12    Period Weeks    Status  Partially Met    Target Date 04/02/22      PT LONG TERM GOAL #2   Title Patient will increase BUE gross strength and gross strength of cervical musculature to at least a 4+/5 to improve ADL ability and QOL.    Baseline 3/22: UE strength grossly 4/5 B, cervical musculature grossly  4-/5, 4/24: see flowsheet 6/12: see flowsheet 7/10: grossly 4+/5   Time 12    Period Weeks    Status Met    Target Date 04/02/22      PT LONG TERM GOAL #3   Title Pt will demonstrate 15 degree increase in cervical extension ROM and B cervical rotation ROM that  is pain free for improved functional mobility and QOL.    Baseline 3/22: ext 40 deg and painful, rotation R 60 deg and painful, rotation L 40 deg and painful, 4/24: see flowsheet 6/12: flexion 41, extension 46, side bend 30 R 40 L, rotation 52 right, 61L. 7/10: see note   Time 12    Period Weeks    Status Partially Met    Target Date 04/02/22      PT LONG TERM GOAL #4   Title Patient will reduce Neck Disability Index score to <10% to demonstrate minimal disability with ADL's including improved sleeping tolerance, sitting tolerance, etc for better mobility at home and work.    Baseline 3/22: to be completed future session 6/12: 52%  7/10: 30%   Time 12    Period Weeks    Status Partially Met    Target Date 04/02/22                Plan     Clinical Impression Statement Patient re-educated about improtance of coming to PT on time due to coming at wrong time yet again this session. Patient's goals addressed.  Patient's condition has the potential to improve in response to therapy. Maximum improvement is yet to be obtained. The anticipated improvement is attainable and reasonable in a generally predictable time.Patient will continue to benefit from skilled physical therapy to improve strength, mobility, and decrease pain with ADLs    Personal Factors and Comorbidities Time since onset of injury/illness/exacerbation;Sex;Profession;Comorbidity 1     Comorbidities chronic back pain    Examination-Activity Limitations Bend;Locomotion Level;Hygiene/Grooming;Stairs;Stand;Sit;Sleep;Squat;Lift    Examination-Participation Restrictions Yard Work;Shop;Occupation;Community Activity;Cleaning;Meal Prep;Laundry    Stability/Clinical Decision Making Evolving/Moderate complexity    Rehab Potential Good    PT Frequency 1x / week    PT Duration 12 weeks    PT Treatment/Interventions ADLs/Self Care Home Management;Canalith Repostioning;Cryotherapy;Electrical Stimulation;Iontophoresis 21m/ml Dexamethasone;Moist Heat;Traction;Ultrasound;DME Instruction;Gait training;Stair training;Functional mobility training;Therapeutic activities;Therapeutic exercise;Balance training;Neuromuscular re-education;Patient/family education;Orthotic Fit/Training;Manual techniques;Passive range of motion;Dry needling;Energy conservation;Splinting;Taping;Vestibular;Visual/perceptual remediation/compensation;Joint Manipulations    PT Next Visit Plan TDN, postural strengthening    PT Home Exercise Plan to be issued next 1-2 visits    Consulted and Agree with Plan of Care Patient;Other (Comment)   interpreter             MJanna Arch PT, DPT  02/05/2022, 8:53 AM

## 2022-02-05 ENCOUNTER — Ambulatory Visit: Payer: Medicaid Other

## 2022-02-05 DIAGNOSIS — M542 Cervicalgia: Secondary | ICD-10-CM

## 2022-02-05 DIAGNOSIS — M6281 Muscle weakness (generalized): Secondary | ICD-10-CM

## 2022-02-05 DIAGNOSIS — G8929 Other chronic pain: Secondary | ICD-10-CM

## 2022-02-12 NOTE — Therapy (Signed)
OUTPATIENT PHYSICAL THERAPY TREATMENT NOTE   Patient Name: Sabrina Wolfe MRN: [id number] DOB:Apr 05, 1981, 41 y.o., female Today's Date: 02/13/2022  PCP: Remus Blake , MD  REFERRING PROVIDER: Hardie Lora PA   PT End of Session - 02/13/22 0707     Visit Number 21    Number of Visits 28    Date for PT Re-Evaluation 04/02/22    Authorization Type Medicaid [employer]-    Authorization Time Period 3/6 sessions left    PT Start Time 0713    PT Stop Time 0759    PT Time Calculation (min) 46 min    Activity Tolerance Patient tolerated treatment well;No increased pain    Behavior During Therapy [facility] for tasks assessed/performed                History reviewed. No pertinent past medical history. Past Surgical History:  Procedure Laterality Date   CHOLECYSTECTOMY     20 years ago   There are no problems to display for this patient.   REFERRING DIAG: radiculopathy, spondylosis without myelopathy   THERAPY DIAG:  Cervicalgia  Muscle weakness (generalized)  Chronic low back pain, unspecified back pain laterality, unspecified whether sciatica present  Right-sided low back pain with right-sided sciatica, unspecified chronicity  Rationale for Evaluation and Treatment Rehabilitation  PERTINENT HISTORY: Pt presents to PT evaluation with interpreter (pt primarily speaks Spanish) Pt in [place] 12/25/2020. Previously seen at another PT clinic to address LBP (back pain did not resolve). Pt reports immediate LBP and neck pain following accident that has remained. Pt with difficulty keeping her neck in a neutral position or with bending her neck to look down due to pain. She feels her neck fatigues quicky. She feels this pain mostly on her R side. Pt has N/T in B hands and BLEs. She endorses headaches that "wrap around" from occipital region to her L eye. Pt does feel dizzy at times when going from a bent over to standing position. She describes this as spinning. She  reports she has noticed changes in her memory since the accident and that she "forgets things easily." She reports her balance has worsened. Pt has not yet seen a neurologist. Pt reports her pain has significantly affected her ability to be active and perform her job duties. Pt formerly very active, enjoyed exercising. Her pain is affecting her sleep. She can no longer lie down on her L side without R side neck pain. She describes difficulties with turning and that her R low back "feels like it wants to come apart."   PRECAUTIONS: n/a  SUBJECTIVE: Patient reports her neck is feeling better today, didn't have to work this weekend so is feeling better. Patient reports her back continues to make her feel like her leg will give out from under her.   PAIN:  Are you having pain? Yes: NPRS scale: 3/10 Pain location: cervical region Pain description: aching Aggravating factors: work Relieving factors: rest     TODAY'S TREATMENT:      TREATMENT:     Manual Therapy:  Patient Prone:  PT performed grade II-III PA mobs at T1-T8 10 sec bouts x2 reps each level with moderate discomfort and guarding at T4-T7 Grade II mobilizations to cervical spine x 3 minutes Suboccipital release 30 seconds x2 trials    TherEx Chin tuck 3 seconds 10x On half foam roller: -robber stretch 60 seconds -arnold stretch 10x -RTB overhead raise  Sciatic nerve glide 5x5 Posterior pelvic tilt with adduction for  back pain reduction. 10x   Trigger Point Dry Needling (TDN), unbilled Education performed with patient regarding potential benefit of TDN. Reviewed precautions and risks with patient. Reviewed special precautions/risks over lung fields which include pneumothorax. Reviewed signs and symptoms of pneumothorax and advised pt to go to ER immediately if these symptoms develop advise them of dry needling treatment. Extensive time spent with pt to ensure full understanding of TDN risks. Pt provided verbal consent to  treatment. TDN performed to  with 0.3 x 30 single needle placements with local twitch response (LTR). Pistoning technique utilized. Improved pain-free motion following intervention. Muscles targeted L cervical paraspinals, L and R upper traps, x 5  minutes    Pt educated throughout session about proper posture and technique with exercises. Improved exercise technique, movement at target joints, use of target muscles after min to mod verbal, visual, tactile cues.     PATIENT EDUCATION: Education details: exercise technique, pain reduction, TDN Person educated: Patient Education method: Explanation, Demonstration, Tactile cues, and Verbal cues Education comprehension: verbalized understanding, returned demonstration, verbal cues required, and tactile cues required   HOME EXERCISE PROGRAM: Access Code: [place] URL: [email or web address] Date: 02/05/2022 Prepared by: Janna Arch  Exercises - Seated Scapular Retraction  - 1 x daily - 7 x weekly - 2 sets - 10 reps - 5 hold - Seated Upper Trapezius Stretch  - 1 x daily - 7 x weekly - 2 sets - 2 reps - 30 hold - Standing 'L' Stretch at Counter  - 1 x daily - 7 x weekly - 2 sets - 10 reps - 5 hold - Cervical Extension AROM with Strap  - 1 x daily - 7 x weekly - 2 sets - 10 reps - 5 hold  Access Code: [email or web address] URL: [email or web address] Date: 02/13/2022 Prepared by: Janna Arch  Exercises - Supine Sciatic Nerve Glide  - 1 x daily - 7 x weekly - 2 sets - 5 reps - 5 hold - Posterior Pelvic Tilt with Adduction Using Ball in Supine  - 1 x daily - 7 x weekly - 2 sets - 10 reps - 5 hold   PT Short Term Goals      PT SHORT TERM GOAL #1   Title Patient will be independent in home exercise program to improve strength/mobility for better functional independence with ADLs.    Baseline 3/22: to be issued next 1-2 visits 4/24: independent and adherent; 6/12: compliant    Time 6    Period Weeks    Status Achieved     Target Date 11/29/21      PT SHORT TERM GOAL #2   Title Patient will report a worst pain of 3/10 to improve tolerance with ADLs and reduced symptoms with work activities.    Baseline 3/22: worst pain 8/10, 4/24: worst pain: 7-8/10 in neck 6/12: 5-6/10 7/10: 7/10    Time 6    Period Weeks    Status Partially Met    Target Date 02/19/22              PT Long Term Goals      PT LONG TERM GOAL #1   Title Patient will increase FOTO score to equal to or greater than 71  to demonstrate statistically significant improvement in mobility and quality of life.    Baseline 3/22: 60, 4/24: 60% 6/12: 64% 7/10: 53%    Time 12    Period Weeks    Status Partially Met    Target Date 04/02/22  PT LONG TERM GOAL #2   Title Patient will increase BUE gross strength and gross strength of cervical musculature to at least a 4+/5 to improve ADL ability and QOL.    Baseline 3/22: UE strength grossly 4/5 B, cervical musculature grossly  4-/5, 4/24: see flowsheet 6/12: see flowsheet 7/10: grossly 4+/5   Time 12    Period Weeks    Status Met    Target Date 04/02/22      PT LONG TERM GOAL #3   Title Pt will demonstrate 15 degree increase in cervical extension ROM and B cervical rotation ROM that is pain free for improved functional mobility and QOL.    Baseline 3/22: ext 40 deg and painful, rotation R 60 deg and painful, rotation L 40 deg and painful, 4/24: see flowsheet 6/12: flexion 41, extension 46, side bend 30 R 40 L, rotation 52 right, 61L. 7/10: see note   Time 12    Period Weeks    Status Partially Met    Target Date 04/02/22      PT LONG TERM GOAL #4   Title Patient will reduce Neck Disability Index score to <10% to demonstrate minimal disability with ADL's including improved sleeping tolerance, sitting tolerance, etc for better mobility at home and work.    Baseline 3/22: to be completed future session 6/12: 52%  7/10: 30%   Time 12    Period Weeks    Status Partially Met    Target Date  04/02/22                Plan     Clinical Impression Statement Patient does have intermittent headache that does not seem to be correlated with headache, patient thinks it is from the medication. Patient has significant trigger points of bilateral upper traps that are released with TDN. HEP for back pain prescribed. Postural strengthening and stabilization tolerated well.  Patient will continue to benefit from skilled physical therapy to improve strength, mobility, and decrease pain with ADLs    Personal Factors and Comorbidities Time since onset of injury/illness/exacerbation;Sex;Profession;Comorbidity 1    Comorbidities chronic back pain    Examination-Activity Limitations Bend;Locomotion Level;Hygiene/Grooming;Stairs;Stand;Sit;Sleep;Squat;Lift    Examination-Participation Restrictions Yard Work;Shop;Occupation;Community Activity;Cleaning;Meal Prep;Laundry    Stability/Clinical Decision Making Evolving/Moderate complexity    Rehab Potential Good    PT Frequency 1x / week    PT Duration 12 weeks    PT Treatment/Interventions ADLs/Self Care Home Management;Canalith Repostioning;Cryotherapy;Electrical Stimulation;Iontophoresis 88m/ml Dexamethasone;Moist Heat;Traction;Ultrasound;DME Instruction;Gait training;Stair training;Functional mobility training;Therapeutic activities;Therapeutic exercise;Balance training;Neuromuscular re-education;Patient/family education;Orthotic Fit/Training;Manual techniques;Passive range of motion;Dry needling;Energy conservation;Splinting;Taping;Vestibular;Visual/perceptual remediation/compensation;Joint Manipulations    PT Next Visit Plan TDN, postural strengthening    PT Home Exercise Plan to be issued next 1-2 visits    Consulted and Agree with Plan of Care Patient;Other (Comment)   interpreter             MJanna Arch PT, DPT  02/13/2022, 8:01 AM

## 2022-02-13 ENCOUNTER — Ambulatory Visit: Payer: Medicaid Other

## 2022-02-13 DIAGNOSIS — M542 Cervicalgia: Secondary | ICD-10-CM | POA: Diagnosis not present

## 2022-02-13 DIAGNOSIS — G8929 Other chronic pain: Secondary | ICD-10-CM

## 2022-02-13 DIAGNOSIS — M6281 Muscle weakness (generalized): Secondary | ICD-10-CM

## 2022-02-13 DIAGNOSIS — M5441 Lumbago with sciatica, right side: Secondary | ICD-10-CM

## 2022-02-19 NOTE — Therapy (Signed)
OUTPATIENT PHYSICAL THERAPY TREATMENT NOTE   Patient Name: Sabrina Wolfe MRN: [id number] DOB:Mar 30, 1981, 41 y.o., female Today's Date: 02/20/2022  PCP: Remus Blake , MD  REFERRING PROVIDER: Hardie Lora PA   PT End of Session - 02/20/22 0809     Visit Number 22    Number of Visits 28    Date for PT Re-Evaluation 04/02/22    Authorization Type Medicaid [employer]-    Authorization Time Period 4/6 sessions left    PT Start Time 0810    PT Stop Time 0845    PT Time Calculation (min) 35 min    Activity Tolerance Patient tolerated treatment well;No increased pain    Behavior During Therapy [facility] for tasks assessed/performed                 History reviewed. No pertinent past medical history. Past Surgical History:  Procedure Laterality Date   CHOLECYSTECTOMY     20 years ago   There are no problems to display for this patient.   REFERRING DIAG: radiculopathy, spondylosis without myelopathy   THERAPY DIAG:  Cervicalgia  Muscle weakness (generalized)  Chronic low back pain, unspecified back pain laterality, unspecified whether sciatica present  Rationale for Evaluation and Treatment Rehabilitation  PERTINENT HISTORY: Pt presents to PT evaluation with interpreter (pt primarily speaks Spanish) Pt in [place] 12/25/2020. Previously seen at another PT clinic to address LBP (back pain did not resolve). Pt reports immediate LBP and neck pain following accident that has remained. Pt with difficulty keeping her neck in a neutral position or with bending her neck to look down due to pain. She feels her neck fatigues quicky. She feels this pain mostly on her R side. Pt has N/T in B hands and BLEs. She endorses headaches that "wrap around" from occipital region to her L eye. Pt does feel dizzy at times when going from a bent over to standing position. She describes this as spinning. She reports she has noticed changes in her memory since the accident and that she  "forgets things easily." She reports her balance has worsened. Pt has not yet seen a neurologist. Pt reports her pain has significantly affected her ability to be active and perform her job duties. Pt formerly very active, enjoyed exercising. Her pain is affecting her sleep. She can no longer lie down on her L side without R side neck pain. 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 Aware she has limited visits left.   PAIN:  Are you having pain? Yes: NPRS scale: 3/10 Pain location: cervical region Pain description: aching Aggravating factors: work Relieving factors: rest     TODAY'S TREATMENT:      TREATMENT:     Manual Therapy:  Patient Prone:  PT performed grade II-III PA mobs at T1-T8 10 sec bouts x2 reps each level with moderate discomfort and guarding at T4-T7 Grade II mobilizations to cervical spine x 3 minutes Suboccipital release 30 seconds x2 trials    TherEx Chin tuck 3 seconds 10x On half foam roller: -robber stretch 60 seconds -arnold stretch 10x -RTB overhead raise 15x  Standing matrix machine row 15x #12.5   Trigger Point Dry Needling (TDN), unbilled Education performed with patient regarding potential benefit of TDN. Reviewed precautions and risks with patient. Reviewed special precautions/risks over  lung fields which include pneumothorax. Reviewed signs and symptoms of pneumothorax and advised pt to go to ER immediately if these symptoms develop advise them of dry needling treatment. Extensive time spent with pt to ensure full understanding of TDN risks. Pt provided verbal consent to treatment. TDN performed to  with 0.3 x 30 single needle placements with local twitch response (LTR). Pistoning technique utilized. Improved pain-free motion following intervention. Muscles targeted L cervical  paraspinals, L and R upper traps, x 6  minutes    Pt educated throughout session about proper posture and technique with exercises. Improved exercise technique, movement at target joints, use of target muscles after min to mod verbal, visual, tactile cues.     PATIENT EDUCATION: Education details: exercise technique, pain reduction, TDN Person educated: Patient Education method: Explanation, Demonstration, Tactile cues, and Verbal cues Education comprehension: verbalized understanding, returned demonstration, verbal cues required, and tactile cues required   HOME EXERCISE PROGRAM: Access Code: [place] URL: [email or web address] Date: 02/05/2022 Prepared by: Janna Arch  Exercises - Seated Scapular Retraction  - 1 x daily - 7 x weekly - 2 sets - 10 reps - 5 hold - Seated Upper Trapezius Stretch  - 1 x daily - 7 x weekly - 2 sets - 2 reps - 30 hold - Standing 'L' Stretch at Counter  - 1 x daily - 7 x weekly - 2 sets - 10 reps - 5 hold - Cervical Extension AROM with Strap  - 1 x daily - 7 x weekly - 2 sets - 10 reps - 5 hold  Access Code: [email or web address] URL: [email or web address] Date: 02/13/2022 Prepared by: Janna Arch  Exercises - Supine Sciatic Nerve Glide  - 1 x daily - 7 x weekly - 2 sets - 5 reps - 5 hold - Posterior Pelvic Tilt with Adduction Using Ball in Supine  - 1 x daily - 7 x weekly - 2 sets - 10 reps - 5 hold   PT Short Term Goals      PT SHORT TERM GOAL #1   Title Patient will be independent in home exercise program to improve strength/mobility for better functional independence with ADLs.    Baseline 3/22: to be issued next 1-2 visits 4/24: independent and adherent; 6/12: compliant    Time 6    Period Weeks    Status Achieved    Target Date 11/29/21      PT SHORT TERM GOAL #2   Title Patient will report a worst pain of 3/10 to improve tolerance with ADLs and reduced symptoms with work activities.    Baseline 3/22: worst pain 8/10,  4/24: worst pain: 7-8/10 in neck 6/12: 5-6/10 7/10: 7/10    Time 6    Period Weeks    Status Partially Met    Target Date 02/19/22              PT Long Term Goals      PT LONG TERM GOAL #1   Title Patient will increase FOTO score to equal to or greater than 71  to demonstrate statistically significant improvement in mobility and quality of life.    Baseline 3/22: 60, 4/24: 60% 6/12: 64% 7/10: 53%    Time 12    Period Weeks    Status Partially Met    Target Date 04/02/22      PT LONG TERM GOAL #2   Title Patient will increase BUE gross strength and gross strength of cervical musculature to at least a 4+/5 to improve  ADL ability and QOL.    Baseline 3/22: UE strength grossly 4/5 B, cervical musculature grossly  4-/5, 4/24: see flowsheet 6/12: see flowsheet 7/10: grossly 4+/5   Time 12    Period Weeks    Status Met    Target Date 04/02/22      PT LONG TERM GOAL #3   Title Pt will demonstrate 15 degree increase in cervical extension ROM and B cervical rotation ROM that is pain free for improved functional mobility and QOL.    Baseline 3/22: ext 40 deg and painful, rotation R 60 deg and painful, rotation L 40 deg and painful, 4/24: see flowsheet 6/12: flexion 41, extension 46, side bend 30 R 40 L, rotation 52 right, 61L. 7/10: see note   Time 12    Period Weeks    Status Partially Met    Target Date 04/02/22      PT LONG TERM GOAL #4   Title Patient will reduce Neck Disability Index score to <10% to demonstrate minimal disability with ADL's including improved sleeping tolerance, sitting tolerance, etc for better mobility at home and work.    Baseline 3/22: to be completed future session 6/12: 52%  7/10: 30%   Time 12    Period Weeks    Status Partially Met    Target Date 04/02/22                Plan     Clinical Impression Statement Patient session limited by late arrival. Patient aware that she has limited visits left. Multiple large trigger points released  bilateral upper trap.  Patient will continue to benefit from skilled physical therapy to improve strength, mobility, and decrease pain with ADLs    Personal Factors and Comorbidities Time since onset of injury/illness/exacerbation;Sex;Profession;Comorbidity 1    Comorbidities chronic back pain    Examination-Activity Limitations Bend;Locomotion Level;Hygiene/Grooming;Stairs;Stand;Sit;Sleep;Squat;Lift    Examination-Participation Restrictions Yard Work;Shop;Occupation;Community Activity;Cleaning;Meal Prep;Laundry    Stability/Clinical Decision Making Evolving/Moderate complexity    Rehab Potential Good    PT Frequency 1x / week    PT Duration 12 weeks    PT Treatment/Interventions ADLs/Self Care Home Management;Canalith Repostioning;Cryotherapy;Electrical Stimulation;Iontophoresis 36m/ml Dexamethasone;Moist Heat;Traction;Ultrasound;DME Instruction;Gait training;Stair training;Functional mobility training;Therapeutic activities;Therapeutic exercise;Balance training;Neuromuscular re-education;Patient/family education;Orthotic Fit/Training;Manual techniques;Passive range of motion;Dry needling;Energy conservation;Splinting;Taping;Vestibular;Visual/perceptual remediation/compensation;Joint Manipulations    PT Next Visit Plan TDN, postural strengthening    PT Home Exercise Plan to be issued next 1-2 visits    Consulted and Agree with Plan of Care Patient;Other (Comment)   interpreter             MJanna Arch PT, DPT  02/20/2022, 8:45 AM

## 2022-02-20 ENCOUNTER — Ambulatory Visit: Payer: Medicaid Other

## 2022-02-20 DIAGNOSIS — M542 Cervicalgia: Secondary | ICD-10-CM

## 2022-02-20 DIAGNOSIS — M545 Low back pain, unspecified: Secondary | ICD-10-CM

## 2022-02-20 DIAGNOSIS — M6281 Muscle weakness (generalized): Secondary | ICD-10-CM
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
## 2022-02-27 ENCOUNTER — Ambulatory Visit: Payer: Medicaid Other | Attending: Orthopedic Surgery

## 2022-02-27 DIAGNOSIS — G8929 Other chronic pain: Secondary | ICD-10-CM | POA: Diagnosis present

## 2022-02-27 DIAGNOSIS — M545 Low back pain, unspecified: Secondary | ICD-10-CM | POA: Diagnosis present

## 2022-02-27 DIAGNOSIS — M6281 Muscle weakness (generalized): Secondary | ICD-10-CM | POA: Diagnosis present

## 2022-02-27 DIAGNOSIS — M542 Cervicalgia: Secondary | ICD-10-CM | POA: Insufficient documentation
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
## 2022-03-06 ENCOUNTER — Ambulatory Visit: Payer: Medicaid Other

## 2022-03-06 DIAGNOSIS — M6281 Muscle weakness (generalized): Secondary | ICD-10-CM

## 2022-03-06 DIAGNOSIS — M545 Low back pain, unspecified: Secondary | ICD-10-CM

## 2022-03-06 DIAGNOSIS — M542 Cervicalgia: Secondary | ICD-10-CM

## 2022-03-12 ENCOUNTER — Ambulatory Visit: Payer: Medicaid Other

## 2022-03-19 ENCOUNTER — Ambulatory Visit: Payer: Medicaid Other

## 2022-03-19 ENCOUNTER — Other Ambulatory Visit: Payer: Self-pay | Admitting: Family Medicine

## 2022-03-19 DIAGNOSIS — Z1231 Encounter for screening mammogram for malignant neoplasm of breast: Secondary | ICD-10-CM

## 2022-03-26 ENCOUNTER — Ambulatory Visit: Payer: Medicaid Other

## 2022-09-28 ENCOUNTER — Encounter: Payer: Self-pay | Admitting: Internal Medicine

## 2022-10-09 ENCOUNTER — Other Ambulatory Visit: Payer: Self-pay

## 2022-10-09 DIAGNOSIS — Z1231 Encounter for screening mammogram for malignant neoplasm of breast: Secondary | ICD-10-CM

## 2022-10-22 ENCOUNTER — Ambulatory Visit
Admission: RE | Admit: 2022-10-22 | Discharge: 2022-10-22 | Disposition: A | Discharge status: Home / Self Care | Payer: Medicaid Other | Source: Ambulatory Visit | Attending: Obstetrics and Gynecology | Admitting: Obstetrics and Gynecology

## 2022-10-22 ENCOUNTER — Ambulatory Visit: Payer: Self-pay | Attending: Hematology and Oncology | Admitting: Hematology and Oncology

## 2022-10-22 VITALS — BP 103/72 | Wt 134.0 lb

## 2022-10-22 DIAGNOSIS — Z01419 Encounter for gynecological examination (general) (routine) without abnormal findings: Secondary | ICD-10-CM

## 2022-10-22 DIAGNOSIS — Z1231 Encounter for screening mammogram for malignant neoplasm of breast: Secondary | ICD-10-CM | POA: Insufficient documentation

## 2022-10-22 NOTE — Progress Notes (Signed)
[name] is a 42 y.o. No obstetric history on file. female who presents to [facility] clinic today with no complaints.    Pap Smear: Pap smear completed today. Last Pap smear was 2021 at [facility] clinic and was normal. Per patient has no history of an abnormal Pap smear. Last Pap smear result is available in Epic.   Physical exam: Breasts Breasts symmetrical. No skin abnormalities bilateral breasts. No nipple retraction bilateral breasts. No nipple discharge bilateral breasts. No lymphadenopathy. No lumps palpated bilateral breasts.       Pelvic/Bimanual Ext Genitalia No lesions, no swelling and no discharge observed on external genitalia.        Vagina Vagina pink and normal texture. No lesions or discharge observed in vagina.        Cervix Cervix is present. Cervix pink and of normal texture. No discharge observed.    Uterus Uterus is present and palpable. Uterus in normal position and normal size.        Adnexae Bilateral ovaries present and palpable. No tenderness on palpation.         Rectovaginal No rectal exam completed today since patient had no rectal complaints. No skin abnormalities observed on exam.     Smoking History: Patient has never smoked and was not referred to quit line.    Patient Navigation: Patient education provided. Access to services provided for patient through [place] interpreter provided. No transportation provided   Colorectal Cancer Screening: Per patient has never had colonoscopy completed No complaints today.    Breast and Cervical Cancer Risk Assessment: Patient does not have family history of breast cancer, known genetic mutations, or radiation treatment to the chest before age 3. Patient does not have history of cervical dysplasia, immunocompromised, or DES exposure in-utero.  Risk Scores as of 10/22/2022     Sabrina Wolfe           5-year 0.44 %   Lifetime 7.27 %   This patient is [place]/Latina but  has no documented birth country, so the [place] used data from [place] patients to calculate their risk score. Document a birth country in the Demographics activity for a more accurate score.         Last calculated by Sabrina Wolfe, CMA on 10/22/2022 at 10:51 AM        A: [facility] exam with pap smear No complaints with benign exams.   P: [facility]. Appointment scheduled 10/22/22.  Melodye Ped, NP 10/22/2022 11:06 AM

## 2022-10-22 NOTE — Patient Instructions (Signed)
Taught Sabrina Wolfe about self breast awareness and gave educational materials to take home. Patient did need a Pap smear today due to last Pap smear was in 2021 and was ASCUS per patient. Let her know [facility] will cover Pap smears every 5 years unless has a history of abnormal Pap smears. [facility]. Appointment scheduled for 10/22/22. Patient aware of appointment and will be there. Let patient know will follow up with her within the next couple weeks with results. Sabrina Wolfe verbalized understanding.  Melodye Ped, NP 11:08 AM

## 2022-10-26 LAB — CYTOLOGY - PAP
Adequacy: ABSENT
Comment: NEGATIVE
Diagnosis: NEGATIVE
High risk HPV: NEGATIVE

## 2023-07-10 ENCOUNTER — Other Ambulatory Visit: Payer: Self-pay | Admitting: Neurology

## 2023-07-10 DIAGNOSIS — R519 Headache, unspecified: Secondary | ICD-10-CM

## 2023-07-10 DIAGNOSIS — H53149 Visual discomfort, unspecified: Secondary | ICD-10-CM

## 2023-07-10 DIAGNOSIS — G3184 Mild cognitive impairment, so stated: Secondary | ICD-10-CM

## 2023-07-10 DIAGNOSIS — F40298 Other specified phobia: Secondary | ICD-10-CM

## 2023-08-02 ENCOUNTER — Encounter: Payer: Self-pay | Admitting: Neurology

## 2023-08-07 ENCOUNTER — Ambulatory Visit
Admission: RE | Admit: 2023-08-07 | Discharge: 2023-08-07 | Disposition: A | Discharge status: Home / Self Care | Payer: No Typology Code available for payment source | Source: Ambulatory Visit | Attending: Neurology | Admitting: Neurology

## 2023-08-07 DIAGNOSIS — R519 Headache, unspecified: Secondary | ICD-10-CM

## 2023-08-07 DIAGNOSIS — H53149 Visual discomfort, unspecified: Secondary | ICD-10-CM

## 2023-08-07 DIAGNOSIS — F40298 Other specified phobia: Secondary | ICD-10-CM

## 2023-08-07 DIAGNOSIS — G3184 Mild cognitive impairment, so stated: Secondary | ICD-10-CM
